# Patient Record
Sex: Female | Born: 1980 | Hispanic: Yes | Marital: Single | Smoking: Never smoker
Health system: Southern US, Community
[De-identification: ages and names within clinical notes are randomized; demographics above are authoritative.]

## PROBLEM LIST (undated history)

## (undated) ENCOUNTER — Emergency Department: Payer: Self-pay

## (undated) DIAGNOSIS — K831 Obstruction of bile duct: Secondary | ICD-10-CM

## (undated) DIAGNOSIS — O26619 Liver and biliary tract disorders in pregnancy, unspecified trimester: Secondary | ICD-10-CM

## (undated) DIAGNOSIS — O26649 Intrahepatic cholestasis of pregnancy, unspecified trimester: Secondary | ICD-10-CM

---

## 2005-05-12 ENCOUNTER — Emergency Department (HOSPITAL_COMMUNITY): Admission: EM | Admit: 2005-05-12 | Discharge: 2005-05-12 | Payer: Self-pay | Admitting: Emergency Medicine

## 2008-02-04 ENCOUNTER — Inpatient Hospital Stay (HOSPITAL_COMMUNITY): Admission: AD | Admit: 2008-02-04 | Discharge: 2008-02-04 | Payer: Self-pay | Admitting: Obstetrics & Gynecology

## 2008-02-06 ENCOUNTER — Inpatient Hospital Stay (HOSPITAL_COMMUNITY): Admission: AD | Admit: 2008-02-06 | Discharge: 2008-02-06 | Payer: Self-pay | Admitting: Obstetrics & Gynecology

## 2008-02-11 ENCOUNTER — Ambulatory Visit (HOSPITAL_COMMUNITY): Admission: RE | Admit: 2008-02-11 | Discharge: 2008-02-11 | Payer: Self-pay | Admitting: Obstetrics & Gynecology

## 2010-07-04 ENCOUNTER — Ambulatory Visit: Payer: Self-pay | Admitting: Gynecology

## 2011-04-20 LAB — CBC
HCT: 36.2
MCV: 70.1 — ABNORMAL LOW
Platelets: 237
RBC: 5.17 — ABNORMAL HIGH
RDW: 14.1
WBC: 5.2

## 2011-04-20 LAB — POCT PREGNANCY, URINE
Operator id: 242691
Preg Test, Ur: POSITIVE

## 2011-04-20 LAB — WET PREP, GENITAL: Yeast Wet Prep HPF POC: NONE SEEN

## 2011-04-20 LAB — URINALYSIS, ROUTINE W REFLEX MICROSCOPIC
Glucose, UA: NEGATIVE
Leukocytes, UA: NEGATIVE
pH: 6

## 2011-04-20 LAB — ABO/RH: ABO/RH(D): A POS

## 2011-04-20 LAB — URINE MICROSCOPIC-ADD ON

## 2013-09-24 ENCOUNTER — Other Ambulatory Visit (HOSPITAL_COMMUNITY): Payer: Self-pay | Admitting: Family

## 2013-09-24 DIAGNOSIS — Z3689 Encounter for other specified antenatal screening: Secondary | ICD-10-CM

## 2013-09-24 LAB — SICKLE CELL SCREEN: Sickle Cell Screen: NORMAL

## 2013-09-24 LAB — OB RESULTS CONSOLE HGB/HCT, BLOOD
HCT: 34 %
HEMATOCRIT: 34 %
HEMOGLOBIN: 10.8 g/dL
Hemoglobin: 10.8 g/dL

## 2013-09-24 LAB — CYSTIC FIBROSIS DIAGNOSTIC STUDY: INTERPRETATION-CFDNA: NEGATIVE

## 2013-09-24 LAB — OB RESULTS CONSOLE HIV ANTIBODY (ROUTINE TESTING)
HIV: NONREACTIVE
HIV: NONREACTIVE

## 2013-09-24 LAB — OB RESULTS CONSOLE RPR
RPR: NONREACTIVE
RPR: NONREACTIVE
RPR: NONREACTIVE

## 2013-09-24 LAB — OB RESULTS CONSOLE VARICELLA ZOSTER ANTIBODY, IGG: Varicella: IMMUNE

## 2013-09-24 LAB — OB RESULTS CONSOLE PLATELET COUNT
PLATELETS: 252 10*3/uL
Platelets: 252 10*3/uL

## 2013-09-24 LAB — GLUCOSE, 1 HOUR GESTATIONAL: GLUCOSE: 74

## 2013-09-24 LAB — OB RESULTS CONSOLE ABO/RH: RH TYPE: POSITIVE

## 2013-09-24 LAB — OB RESULTS CONSOLE ANTIBODY SCREEN: Antibody Screen: NEGATIVE

## 2013-09-24 LAB — OB RESULTS CONSOLE GC/CHLAMYDIA
CHLAMYDIA, DNA PROBE: NEGATIVE
Gonorrhea: NEGATIVE

## 2013-09-24 LAB — OB RESULTS CONSOLE RUBELLA ANTIBODY, IGM: Rubella: IMMUNE

## 2013-09-24 LAB — OB RESULTS CONSOLE HEPATITIS B SURFACE ANTIGEN: Hepatitis B Surface Ag: NEGATIVE

## 2013-10-01 ENCOUNTER — Ambulatory Visit (HOSPITAL_COMMUNITY)
Admission: RE | Admit: 2013-10-01 | Discharge: 2013-10-01 | Disposition: A | Discharge status: Home / Self Care | Payer: Medicaid Other | Source: Ambulatory Visit | Attending: Family | Admitting: Family

## 2013-10-01 DIAGNOSIS — Z3689 Encounter for other specified antenatal screening: Secondary | ICD-10-CM | POA: Insufficient documentation

## 2013-10-12 ENCOUNTER — Other Ambulatory Visit: Payer: Self-pay

## 2013-10-23 ENCOUNTER — Other Ambulatory Visit (HOSPITAL_COMMUNITY): Payer: Self-pay | Admitting: Family

## 2013-10-23 DIAGNOSIS — O283 Abnormal ultrasonic finding on antenatal screening of mother: Secondary | ICD-10-CM

## 2013-10-29 ENCOUNTER — Other Ambulatory Visit (HOSPITAL_COMMUNITY): Payer: Self-pay | Admitting: Family

## 2013-10-29 ENCOUNTER — Ambulatory Visit (HOSPITAL_COMMUNITY)
Admission: RE | Admit: 2013-10-29 | Discharge: 2013-10-29 | Disposition: A | Discharge status: Home / Self Care | Payer: Medicaid Other | Source: Ambulatory Visit | Attending: Family | Admitting: Family

## 2013-10-29 ENCOUNTER — Encounter (HOSPITAL_COMMUNITY): Payer: Self-pay

## 2013-10-29 DIAGNOSIS — Z1389 Encounter for screening for other disorder: Secondary | ICD-10-CM | POA: Insufficient documentation

## 2013-10-29 DIAGNOSIS — O280 Abnormal hematological finding on antenatal screening of mother: Secondary | ICD-10-CM

## 2013-10-29 DIAGNOSIS — Z363 Encounter for antenatal screening for malformations: Secondary | ICD-10-CM | POA: Insufficient documentation

## 2013-10-29 DIAGNOSIS — O283 Abnormal ultrasonic finding on antenatal screening of mother: Secondary | ICD-10-CM

## 2013-10-29 DIAGNOSIS — O358XX0 Maternal care for other (suspected) fetal abnormality and damage, not applicable or unspecified: Secondary | ICD-10-CM | POA: Insufficient documentation

## 2013-10-29 DIAGNOSIS — O289 Unspecified abnormal findings on antenatal screening of mother: Secondary | ICD-10-CM | POA: Insufficient documentation

## 2013-11-10 ENCOUNTER — Other Ambulatory Visit (HOSPITAL_COMMUNITY): Payer: Self-pay | Admitting: Family

## 2013-11-10 DIAGNOSIS — O289 Unspecified abnormal findings on antenatal screening of mother: Secondary | ICD-10-CM

## 2013-11-10 DIAGNOSIS — O358XX0 Maternal care for other (suspected) fetal abnormality and damage, not applicable or unspecified: Secondary | ICD-10-CM

## 2013-12-10 ENCOUNTER — Encounter (HOSPITAL_COMMUNITY): Payer: Self-pay

## 2013-12-10 ENCOUNTER — Ambulatory Visit (HOSPITAL_COMMUNITY)
Admission: RE | Admit: 2013-12-10 | Discharge: 2013-12-10 | Disposition: A | Discharge status: Home / Self Care | Payer: Self-pay | Source: Ambulatory Visit | Attending: Family | Admitting: Family

## 2013-12-10 DIAGNOSIS — O289 Unspecified abnormal findings on antenatal screening of mother: Secondary | ICD-10-CM | POA: Insufficient documentation

## 2013-12-10 DIAGNOSIS — O358XX0 Maternal care for other (suspected) fetal abnormality and damage, not applicable or unspecified: Secondary | ICD-10-CM | POA: Insufficient documentation

## 2014-01-11 LAB — GLUCOSE TOLERANCE, 1 HOUR (50G) W/O FASTING: Glucose, 1 hour: 93

## 2014-01-20 ENCOUNTER — Encounter: Payer: Self-pay | Admitting: Obstetrics & Gynecology

## 2014-01-20 ENCOUNTER — Encounter: Payer: Self-pay | Admitting: *Deleted

## 2014-01-20 ENCOUNTER — Ambulatory Visit (INDEPENDENT_AMBULATORY_CARE_PROVIDER_SITE_OTHER): Payer: Self-pay | Admitting: Obstetrics & Gynecology

## 2014-01-20 VITALS — BP 104/67 | HR 108 | Temp 97.7°F | Ht 60.0 in | Wt 134.9 lb

## 2014-01-20 DIAGNOSIS — O280 Abnormal hematological finding on antenatal screening of mother: Secondary | ICD-10-CM

## 2014-01-20 DIAGNOSIS — O289 Unspecified abnormal findings on antenatal screening of mother: Secondary | ICD-10-CM

## 2014-01-20 DIAGNOSIS — K838 Other specified diseases of biliary tract: Secondary | ICD-10-CM

## 2014-01-20 DIAGNOSIS — O0990 Supervision of high risk pregnancy, unspecified, unspecified trimester: Secondary | ICD-10-CM | POA: Insufficient documentation

## 2014-01-20 DIAGNOSIS — O26619 Liver and biliary tract disorders in pregnancy, unspecified trimester: Secondary | ICD-10-CM

## 2014-01-20 DIAGNOSIS — O0993 Supervision of high risk pregnancy, unspecified, third trimester: Secondary | ICD-10-CM

## 2014-01-20 DIAGNOSIS — O26613 Liver and biliary tract disorders in pregnancy, third trimester: Principal | ICD-10-CM

## 2014-01-20 DIAGNOSIS — K831 Obstruction of bile duct: Secondary | ICD-10-CM

## 2014-01-20 LAB — POCT URINALYSIS DIP (DEVICE)
Bilirubin Urine: NEGATIVE
GLUCOSE, UA: NEGATIVE mg/dL
HGB URINE DIPSTICK: NEGATIVE
KETONES UR: NEGATIVE mg/dL
Leukocytes, UA: NEGATIVE
Nitrite: NEGATIVE
Protein, ur: 30 mg/dL — AB
SPECIFIC GRAVITY, URINE: 1.02 (ref 1.005–1.030)
UROBILINOGEN UA: 0.2 mg/dL (ref 0.0–1.0)
pH: 7 (ref 5.0–8.0)

## 2014-01-20 MED ORDER — URSODIOL 500 MG PO TABS: 500.0000 mg | ORAL_TABLET | Freq: Two times a day (BID) | ORAL | Status: DC

## 2014-01-20 NOTE — Patient Instructions (Signed)
Cholestasis of Pregnancy Cholestasis refers to any condition that causes the flow of the digestive fluid (bile) produced by your liver to slow or stop. Cholestasis of pregnancy is most common toward the end of pregnancy (thirdtrimester), but it can occur any time during your pregnancy. The condition often goes away soon after your baby is born.  Cholestasis may be uncomfortable but is usually harmless to you. However, it can be harmful to your baby. Cholestasis may increase the risk that your baby will be born too early (preterm delivery).  CAUSES  The cause of cholestasis of pregnancy is not known. Pregnancy hormones may affect the way your gallbladder functions. Your gallbladder normally holds the bile from your liver until you need it to help digest fat in your diet. Pregnancy hormones may cause the flow of bile to slow down and back up into your liver. Bile may then get into your bloodstream and cause cholestasis symptoms. RISK FACTORS You may be at increased risk if:  You had cholestasis during a previous pregnancy.  You have a family history of cholestasis.  You have liver problems.  You are having twins. SIGNS AND SYMPTOMS  The most common symptom of cholestasis of pregnancy is intense itching, especially on the palms of your hands and soles of your feet. The itching can spread to the rest of your body and is often worse at night. You will not usually have a rash. Other symptoms may include:   Feeling tired.   Yellowish discoloration of your skin and the whites of your eyes (jaundice).   Dark-colored urine.   Light-colored stools.  Poor appetite.  DIAGNOSIS  Your health care provider will take your medical history and do a physical exam. You may have blood tests to check your liver function, bile level, and bilirubin level.  TREATMENT  Treatment is meant to make you more comfortable and keep your baby safe. Your health care provider may prescribe medicine to relieve your  itching. The medicine used may also improve your blood test results and help keep your baby safe. Your health care provider may also give you vitamin K before delivery to prevent excessive bleeding.  Your health care provider may want to check your baby (fetal monitoring) frequently, as often as every 2 weeks. Once your baby's lungs have developed enough, your health care provider may recommend starting (inducing) your labor and delivery by week 37 of your pregnancy. HOME CARE INSTRUCTIONS   Only use anti-itch creams and take medicines as directed by your health care provider.  Take cool baths to soothe your itching.   Keep your fingernails short to prevent skin irritation from scratching.   Keep all your appointments for fetal monitoring. SEEK MEDICAL CARE IF:  Your symptoms get worse, even with treatment. SEEK IMMEDIATE MEDICAL CARE IF: You go into early labor at home. MAKE SURE YOU:  Understand these instructions.  Will watch your condition.  Will get help right away if you are not doing well or get worse. Document Released: 07/06/2000 Document Revised: 07/14/2013 Document Reviewed: 05/01/2013 [facility] Patient Information 2015 [employer] [place]. This information is not intended to replace advice given to you by your health care provider. Make sure you discuss any questions you have with your health care provider.

## 2014-01-20 NOTE — Progress Notes (Signed)
Patient expressed unable to get Actigall due to financial constraints. Per Dr. Debroah LoopArnold this is medically needed. Contacted Care Management- [facility] [employer] - she will look into this and see if we can help Dominique Foster and call her within the next few days.   Also scheduled BPP as ordered for first available date 01/26/14 and per Dr. Debroah LoopArnold US for growth one week after that since she needs weekly BPP.   01/21/14 notified by nurse care manager they can get her a one month supply of Actigall for $3 for this one time only. They have notified the patient.  Next month she will have to pay out of pocket or hopefully will have insurance then.

## 2014-01-20 NOTE — Progress Notes (Signed)
Patient reports occasional abdominal pain

## 2014-01-20 NOTE — Progress Notes (Signed)
[facility] with generalized itch and bile acids 25 normal LFT o/w. [facility] testing. Will schedule growth US and BPP, NST start at 32 weeks, Rx Actigall 500 mg BID Increased DSR by [employer] screen and EIF on US Records from HD reviewed.

## 2014-01-21 NOTE — Progress Notes (Signed)
01/21/14  915a  CM spoke w/ Stark BrayLynda in clinic to determine [facility].  [facility]  CM called [facility] and spoke w/ Morrie SheldonAshley who verified that she did have the Rx.  CM explained that we would assist the pt in obtaining the Actigall and will fax over the approval letter. Fax number is [phone].  Morrie Sheldonshley requested to have the pt give them about an hour to have the Rx ready for pick up.  CM called and spoke w/ the pt to let her know that the Rx would be ready in about an hour and her copay is $3.00 and that this approval is for 1 month only and can only be used 1 time per year.  Pt voiced understanding.  CM available to assist as needed.   [phone]    Late Entry:  01/20/14  1620p  [facility] to know if CM could assist pt w/ obtaining her Actigall as the pt stated that it was too expensive - $300.00.  Pt does not have Medicaid at this time as she is in the process of applying.  Pt is roughly [redacted] wks gestation.  CM called and spoke w/ the pt at [phone] (before 1100am and [phone] after 11am) to discuss her Rx.  CM explained that approval is good for 34 days only w/ no refills and can only be used 1 time per year.  Pt voiced understanding.  CM unsure if she had the Rx or if her pharmacy had it.  [facility] [phone].  CM will follow up w/ [facility] and call the pt back most likely in am.  Pt voiced understanding.  [facility]  CM will try again in am.  TJohnson, RNBSN

## 2014-01-26 ENCOUNTER — Ambulatory Visit (HOSPITAL_COMMUNITY): Admission: RE | Admit: 2014-01-26 | Payer: Self-pay | Source: Ambulatory Visit

## 2014-01-26 DIAGNOSIS — D649 Anemia, unspecified: Secondary | ICD-10-CM | POA: Insufficient documentation

## 2014-01-28 ENCOUNTER — Ambulatory Visit (INDEPENDENT_AMBULATORY_CARE_PROVIDER_SITE_OTHER): Payer: Self-pay | Admitting: Obstetrics & Gynecology

## 2014-01-28 ENCOUNTER — Other Ambulatory Visit: Payer: Self-pay | Admitting: Obstetrics & Gynecology

## 2014-01-28 VITALS — BP 120/66 | HR 98 | Temp 97.9°F | Wt 138.1 lb

## 2014-01-28 DIAGNOSIS — O26619 Liver and biliary tract disorders in pregnancy, unspecified trimester: Secondary | ICD-10-CM

## 2014-01-28 DIAGNOSIS — O26613 Liver and biliary tract disorders in pregnancy, third trimester: Principal | ICD-10-CM

## 2014-01-28 DIAGNOSIS — O280 Abnormal hematological finding on antenatal screening of mother: Secondary | ICD-10-CM

## 2014-01-28 DIAGNOSIS — O0990 Supervision of high risk pregnancy, unspecified, unspecified trimester: Secondary | ICD-10-CM

## 2014-01-28 DIAGNOSIS — K831 Obstruction of bile duct: Secondary | ICD-10-CM

## 2014-01-28 DIAGNOSIS — O0993 Supervision of high risk pregnancy, unspecified, third trimester: Secondary | ICD-10-CM

## 2014-01-28 DIAGNOSIS — K838 Other specified diseases of biliary tract: Secondary | ICD-10-CM

## 2014-01-28 LAB — POCT URINALYSIS DIP (DEVICE)
BILIRUBIN URINE: NEGATIVE
GLUCOSE, UA: NEGATIVE mg/dL
Hgb urine dipstick: NEGATIVE
Ketones, ur: NEGATIVE mg/dL
NITRITE: NEGATIVE
Protein, ur: 30 mg/dL — AB
Specific Gravity, Urine: 1.02 (ref 1.005–1.030)
Urobilinogen, UA: 0.2 mg/dL (ref 0.0–1.0)
pH: 7 (ref 5.0–8.0)

## 2014-01-28 NOTE — Patient Instructions (Signed)
Return to clinic for any obstetric concerns or go to [facility] for evaluation  

## 2014-01-28 NOTE — Progress Notes (Signed)
Follow up ultrasound and BPP scheduled for tomorrow 7/10 @ 830 with MFM

## 2014-01-28 NOTE — Progress Notes (Signed)
Missed BPP and follow up scan, will reschedule for today/tomorrow Will start twice a week testing next week Continue Ursodiol and Atarax as prescribed. No other complaints or concerns.  Fetal movement and labor precautions reviewed.

## 2014-01-29 ENCOUNTER — Encounter (HOSPITAL_COMMUNITY): Payer: Self-pay

## 2014-01-29 ENCOUNTER — Ambulatory Visit (HOSPITAL_COMMUNITY)
Admission: RE | Admit: 2014-01-29 | Discharge: 2014-01-29 | Disposition: A | Discharge status: Home / Self Care | Payer: Self-pay | Source: Ambulatory Visit | Attending: Obstetrics & Gynecology | Admitting: Obstetrics & Gynecology

## 2014-01-29 DIAGNOSIS — O26619 Liver and biliary tract disorders in pregnancy, unspecified trimester: Secondary | ICD-10-CM | POA: Insufficient documentation

## 2014-01-29 DIAGNOSIS — K838 Other specified diseases of biliary tract: Secondary | ICD-10-CM | POA: Insufficient documentation

## 2014-01-29 DIAGNOSIS — O0993 Supervision of high risk pregnancy, unspecified, third trimester: Secondary | ICD-10-CM

## 2014-01-29 DIAGNOSIS — O26613 Liver and biliary tract disorders in pregnancy, third trimester: Secondary | ICD-10-CM

## 2014-01-29 DIAGNOSIS — K831 Obstruction of bile duct: Secondary | ICD-10-CM

## 2014-01-29 DIAGNOSIS — O280 Abnormal hematological finding on antenatal screening of mother: Secondary | ICD-10-CM

## 2014-01-29 DIAGNOSIS — O289 Unspecified abnormal findings on antenatal screening of mother: Secondary | ICD-10-CM | POA: Insufficient documentation

## 2014-01-29 DIAGNOSIS — O09899 Supervision of other high risk pregnancies, unspecified trimester: Secondary | ICD-10-CM | POA: Insufficient documentation

## 2014-02-01 ENCOUNTER — Ambulatory Visit (INDEPENDENT_AMBULATORY_CARE_PROVIDER_SITE_OTHER): Payer: Self-pay | Admitting: *Deleted

## 2014-02-01 VITALS — BP 111/70 | HR 103

## 2014-02-01 DIAGNOSIS — K831 Obstruction of bile duct: Secondary | ICD-10-CM

## 2014-02-01 DIAGNOSIS — O26613 Liver and biliary tract disorders in pregnancy, third trimester: Principal | ICD-10-CM

## 2014-02-01 DIAGNOSIS — K838 Other specified diseases of biliary tract: Secondary | ICD-10-CM

## 2014-02-01 DIAGNOSIS — O26619 Liver and biliary tract disorders in pregnancy, unspecified trimester: Secondary | ICD-10-CM

## 2014-02-01 NOTE — Progress Notes (Signed)
NST reviewed and reactive.  

## 2014-02-02 ENCOUNTER — Ambulatory Visit (HOSPITAL_COMMUNITY): Payer: Self-pay

## 2014-02-04 ENCOUNTER — Ambulatory Visit (INDEPENDENT_AMBULATORY_CARE_PROVIDER_SITE_OTHER): Payer: Self-pay | Admitting: Family Medicine

## 2014-02-04 VITALS — BP 106/73 | HR 87 | Wt 140.5 lb

## 2014-02-04 DIAGNOSIS — O0993 Supervision of high risk pregnancy, unspecified, third trimester: Secondary | ICD-10-CM

## 2014-02-04 DIAGNOSIS — K838 Other specified diseases of biliary tract: Secondary | ICD-10-CM

## 2014-02-04 DIAGNOSIS — K831 Obstruction of bile duct: Secondary | ICD-10-CM

## 2014-02-04 DIAGNOSIS — O26619 Liver and biliary tract disorders in pregnancy, unspecified trimester: Secondary | ICD-10-CM

## 2014-02-04 DIAGNOSIS — O0990 Supervision of high risk pregnancy, unspecified, unspecified trimester: Secondary | ICD-10-CM

## 2014-02-04 DIAGNOSIS — O26613 Liver and biliary tract disorders in pregnancy, third trimester: Principal | ICD-10-CM

## 2014-02-04 LAB — POCT URINALYSIS DIP (DEVICE)
Bilirubin Urine: NEGATIVE
Glucose, UA: NEGATIVE mg/dL
Hgb urine dipstick: NEGATIVE
Ketones, ur: NEGATIVE mg/dL
NITRITE: NEGATIVE
PH: 7 (ref 5.0–8.0)
Protein, ur: NEGATIVE mg/dL
Specific Gravity, Urine: 1.015 (ref 1.005–1.030)
Urobilinogen, UA: 0.2 mg/dL (ref 0.0–1.0)

## 2014-02-04 LAB — FETAL NONSTRESS TEST

## 2014-02-04 NOTE — Progress Notes (Signed)
NST reviewed and reactive. Continues to itch but better with medication Plan is for delivery at 37 wks.

## 2014-02-04 NOTE — Patient Instructions (Signed)
Third Trimester of Pregnancy The third trimester is from week 29 through week 42, months 7 through 9. The third trimester is a time when the fetus is growing rapidly. At the end of the ninth month, the fetus is about 20 inches in length and weighs 6-10 pounds.  BODY CHANGES Your body goes through many changes during pregnancy. The changes vary from woman to woman.   Your weight will continue to increase. You can expect to gain 25-35 pounds (11-16 kg) by the end of the pregnancy.  You may begin to get stretch marks on your hips, abdomen, and breasts.  You may urinate more often because the fetus is moving lower into your pelvis and pressing on your bladder.  You may develop or continue to have heartburn as a result of your pregnancy.  You may develop constipation because certain hormones are causing the muscles that push waste through your intestines to slow down.  You may develop hemorrhoids or swollen, bulging veins (varicose veins).  You may have pelvic pain because of the weight gain and pregnancy hormones relaxing your joints between the bones in your pelvis. Backaches may result from overexertion of the muscles supporting your posture.  You may have changes in your hair. These can include thickening of your hair, rapid growth, and changes in texture. Some women also have hair loss during or after pregnancy, or hair that feels dry or thin. Your hair will most likely return to normal after your baby is born.  Your breasts will continue to grow and be tender. A yellow discharge may leak from your breasts called colostrum.  Your belly button may stick out.  You may feel short of breath because of your expanding uterus.  You may notice the fetus "dropping," or moving lower in your abdomen.  You may have a bloody mucus discharge. This usually occurs a few days to a week before labor begins.  Your cervix becomes thin and soft (effaced) near your due date. WHAT TO EXPECT AT YOUR  PRENATAL EXAMS  You will have prenatal exams every 2 weeks until week 36. Then, you will have weekly prenatal exams. During a routine prenatal visit:  You will be weighed to make sure you and the fetus are growing normally.  Your blood pressure is taken.  Your abdomen will be measured to track your baby's growth.  The fetal heartbeat will be listened to.  Any test results from the previous visit will be discussed.  You may have a cervical check near your due date to see if you have effaced. At around 36 weeks, your caregiver will check your cervix. At the same time, your caregiver will also perform a test on the secretions of the vaginal tissue. This test is to determine if a type of bacteria, Group B streptococcus, is present. Your caregiver will explain this further. Your caregiver may ask you:  What your birth plan is.  How you are feeling.  If you are feeling the baby move.  If you have had any abnormal symptoms, such as leaking fluid, bleeding, severe headaches, or abdominal cramping.  If you have any questions. Other tests or screenings that may be performed during your third trimester include:  Blood tests that check for low iron levels (anemia).  Fetal testing to check the health, activity level, and growth of the fetus. Testing is done if you have certain medical conditions or if there are problems during the pregnancy. FALSE LABOR You may feel small, irregular contractions that   eventually go away. These are called Braxton Hicks contractions, or false labor. Contractions may last for hours, days, or even weeks before true labor sets in. If contractions come at regular intervals, intensify, or become painful, it is best to be seen by your caregiver.  SIGNS OF LABOR   Menstrual-like cramps.  Contractions that are 5 minutes apart or less.  Contractions that start on the top of the uterus and spread down to the lower abdomen and back.  A sense of increased pelvic  pressure or back pain.  A watery or bloody mucus discharge that comes from the vagina. If you have any of these signs before the 37th week of pregnancy, call your caregiver right away. You need to go to the hospital to get checked immediately. HOME CARE INSTRUCTIONS   Avoid all smoking, herbs, alcohol, and unprescribed drugs. These chemicals affect the formation and growth of the baby.  Follow your caregiver's instructions regarding medicine use. There are medicines that are either safe or unsafe to take during pregnancy.  Exercise only as directed by your caregiver. Experiencing uterine cramps is a good sign to stop exercising.  Continue to eat regular, healthy meals.  Wear a good support bra for breast tenderness.  Do not use hot tubs, steam rooms, or saunas.  Wear your seat belt at all times when driving.  Avoid raw meat, uncooked cheese, cat litter boxes, and soil used by cats. These carry germs that can cause birth defects in the baby.  Take your prenatal vitamins.  Try taking a stool softener (if your caregiver approves) if you develop constipation. Eat more high-fiber foods, such as fresh vegetables or fruit and whole grains. Drink plenty of fluids to keep your urine clear or pale yellow.  Take warm sitz baths to soothe any pain or discomfort caused by hemorrhoids. Use hemorrhoid cream if your caregiver approves.  If you develop varicose veins, wear support hose. Elevate your feet for 15 minutes, 3-4 times a day. Limit salt in your diet.  Avoid heavy lifting, wear low heal shoes, and practice good posture.  Rest a lot with your legs elevated if you have leg cramps or low back pain.  Visit your dentist if you have not gone during your pregnancy. Use a soft toothbrush to brush your teeth and be gentle when you floss.  A sexual relationship may be continued unless your caregiver directs you otherwise.  Do not travel far distances unless it is absolutely necessary and only  with the approval of your caregiver.  Take prenatal classes to understand, practice, and ask questions about the labor and delivery.  Make a trial run to the hospital.  Pack your hospital bag.  Prepare the baby's nursery.  Continue to go to all your prenatal visits as directed by your caregiver. SEEK MEDICAL CARE IF:  You are unsure if you are in labor or if your water has broken.  You have dizziness.  You have mild pelvic cramps, pelvic pressure, or nagging pain in your abdominal area.  You have persistent nausea, vomiting, or diarrhea.  You have a bad smelling vaginal discharge.  You have pain with urination. SEEK IMMEDIATE MEDICAL CARE IF:   You have a fever.  You are leaking fluid from your vagina.  You have spotting or bleeding from your vagina.  You have severe abdominal cramping or pain.  You have rapid weight loss or gain.  You have shortness of breath with chest pain.  You notice sudden or extreme swelling   of your face, hands, ankles, feet, or legs.  You have not felt your baby move in over an hour.  You have severe headaches that do not go away with medicine.  You have vision changes. Document Released: 07/03/2001 Document Revised: 07/14/2013 Document Reviewed: 09/09/2012 [employer] Patient Information 2015 [employer]. This information is not intended to replace advice given to you by your health care provider. Make sure you discuss any questions you have with your health care provider.  Breastfeeding Deciding to breastfeed is one of the best choices you can make for you and your baby. A change in hormones during pregnancy causes your breast tissue to grow and increases the number and size of your milk ducts. These hormones also allow proteins, sugars, and fats from your blood supply to make breast milk in your milk-producing glands. Hormones prevent breast milk from being released before your baby is born as well as prompt milk flow after birth. Once  breastfeeding has begun, thoughts of your baby, as well as his or her sucking or crying, can stimulate the release of milk from your milk-producing glands.  BENEFITS OF BREASTFEEDING For Your Baby  Your first milk (colostrum) helps your baby's digestive system function better.   There are antibodies in your milk that help your baby fight off infections.   Your baby has a lower incidence of asthma, allergies, and sudden infant death syndrome.   The nutrients in breast milk are better for your baby than infant formulas and are designed uniquely for your baby's needs.   Breast milk improves your baby's brain development.   Your baby is less likely to develop other conditions, such as childhood obesity, asthma, or type 2 diabetes mellitus.  For You   Breastfeeding helps to create a very special bond between you and your baby.   Breastfeeding is convenient. Breast milk is always available at the correct temperature and costs nothing.   Breastfeeding helps to burn calories and helps you lose the weight gained during pregnancy.   Breastfeeding makes your uterus contract to its prepregnancy size faster and slows bleeding (lochia) after you give birth.   Breastfeeding helps to lower your risk of developing type 2 diabetes mellitus, osteoporosis, and breast or ovarian cancer later in life. SIGNS THAT YOUR BABY IS HUNGRY Early Signs of Hunger  Increased alertness or activity.  Stretching.  Movement of the head from side to side.  Movement of the head and opening of the mouth when the corner of the mouth or cheek is stroked (rooting).  Increased sucking sounds, smacking lips, cooing, sighing, or squeaking.  Hand-to-mouth movements.  Increased sucking of fingers or hands. Late Signs of Hunger  Fussing.  Intermittent crying. Extreme Signs of Hunger Signs of extreme hunger will require calming and consoling before your baby will be able to breastfeed successfully. Do not  wait for the following signs of extreme hunger to occur before you initiate breastfeeding:   Restlessness.  A loud, strong cry.   Screaming. BREASTFEEDING BASICS Breastfeeding Initiation  Find a comfortable place to sit or lie down, with your neck and back well supported.  Place a pillow or rolled up blanket under your baby to bring him or her to the level of your breast (if you are seated). Nursing pillows are specially designed to help support your arms and your baby while you breastfeed.  Make sure that your baby's abdomen is facing your abdomen.   Gently massage your breast. With your fingertips, massage from your chest   wall toward your nipple in a circular motion. This encourages milk flow. You may need to continue this action during the feeding if your milk flows slowly.  Support your breast with 4 fingers underneath and your thumb above your nipple. Make sure your fingers are well away from your nipple and your baby's mouth.   Stroke your baby's lips gently with your finger or nipple.   When your baby's mouth is open wide enough, quickly bring your baby to your breast, placing your entire nipple and as much of the colored area around your nipple (areola) as possible into your baby's mouth.   More areola should be visible above your baby's upper lip than below the lower lip.   Your baby's tongue should be between his or her lower gum and your breast.   Ensure that your baby's mouth is correctly positioned around your nipple (latched). Your baby's lips should create a seal on your breast and be turned out (everted).  It is common for your baby to suck about 2-3 minutes in order to start the flow of breast milk. Latching Teaching your baby how to latch on to your breast properly is very important. An improper latch can cause nipple pain and decreased milk supply for you and poor weight gain in your baby. Also, if your baby is not latched onto your nipple properly, he or she  may swallow some air during feeding. This can make your baby fussy. Burping your baby when you switch breasts during the feeding can help to get rid of the air. However, teaching your baby to latch on properly is still the best way to prevent fussiness from swallowing air while breastfeeding. Signs that your baby has successfully latched on to your nipple:    Silent tugging or silent sucking, without causing you pain.   Swallowing heard between every 3-4 sucks.    Muscle movement above and in front of his or her ears while sucking.  Signs that your baby has not successfully latched on to nipple:   Sucking sounds or smacking sounds from your baby while breastfeeding.  Nipple pain. If you think your baby has not latched on correctly, slip your finger into the corner of your baby's mouth to break the suction and place it between your baby's gums. Attempt breastfeeding initiation again. Signs of Successful Breastfeeding Signs from your baby:   A gradual decrease in the number of sucks or complete cessation of sucking.   Falling asleep.   Relaxation of his or her body.   Retention of a small amount of milk in his or her mouth.   Letting go of your breast by himself or herself. Signs from you:  Breasts that have increased in firmness, weight, and size 1-3 hours after feeding.   Breasts that are softer immediately after breastfeeding.  Increased milk volume, as well as a change in milk consistency and color by the fifth day of breastfeeding.   Nipples that are not sore, cracked, or bleeding. Signs That Your Baby is Getting Enough Milk  Wetting at least 3 diapers in a 24-hour period. The urine should be clear and pale yellow by age 5 days.  At least 3 stools in a 24-hour period by age 5 days. The stool should be soft and yellow.  At least 3 stools in a 24-hour period by age 7 days. The stool should be seedy and yellow.  No loss of weight greater than 10% of birth weight  during the first 3   days of age.  Average weight gain of 4-7 ounces (113-198 g) per week after age 4 days.  Consistent daily weight gain by age 5 days, without weight loss after the age of 2 weeks. After a feeding, your baby may spit up a small amount. This is common. BREASTFEEDING FREQUENCY AND DURATION Frequent feeding will help you make more milk and can prevent sore nipples and breast engorgement. Breastfeed when you feel the need to reduce the fullness of your breasts or when your baby shows signs of hunger. This is called "breastfeeding on demand." Avoid introducing a pacifier to your baby while you are working to establish breastfeeding (the first 4-6 weeks after your baby is born). After this time you may choose to use a pacifier. Research has shown that pacifier use during the first year of a baby's life decreases the risk of sudden infant death syndrome (SIDS). Allow your baby to feed on each breast as long as he or she wants. Breastfeed until your baby is finished feeding. When your baby unlatches or falls asleep while feeding from the first breast, offer the second breast. Because newborns are often sleepy in the first few weeks of life, you may need to awaken your baby to get him or her to feed. Breastfeeding times will vary from baby to baby. However, the following rules can serve as a guide to help you ensure that your baby is properly fed:  Newborns (babies 4 weeks of age or younger) may breastfeed every 1-3 hours.  Newborns should not go longer than 3 hours during the day or 5 hours during the night without breastfeeding.  You should breastfeed your baby a minimum of 8 times in a 24-hour period until you begin to introduce solid foods to your baby at around 6 months of age. BREAST MILK PUMPING Pumping and storing breast milk allows you to ensure that your baby is exclusively fed your breast milk, even at times when you are unable to breastfeed. This is especially important if you are  going back to work while you are still breastfeeding or when you are not able to be present during feedings. Your lactation consultant can give you guidelines on how long it is safe to store breast milk.  A breast pump is a machine that allows you to pump milk from your breast into a sterile bottle. The pumped breast milk can then be stored in a refrigerator or freezer. Some breast pumps are operated by hand, while others use electricity. Ask your lactation consultant which type will work best for you. Breast pumps can be purchased, but some hospitals and breastfeeding support groups lease breast pumps on a monthly basis. A lactation consultant can teach you how to hand express breast milk, if you prefer not to use a pump.  CARING FOR YOUR BREASTS WHILE YOU BREASTFEED Nipples can become dry, cracked, and sore while breastfeeding. The following recommendations can help keep your breasts moisturized and healthy:  Avoid using soap on your nipples.   Wear a supportive bra. Although not required, special nursing bras and tank tops are designed to allow access to your breasts for breastfeeding without taking off your entire bra or top. Avoid wearing underwire-style bras or extremely tight bras.  Air dry your nipples for 3-4minutes after each feeding.   Use only cotton bra pads to absorb leaked breast milk. Leaking of breast milk between feedings is normal.   Use lanolin on your nipples after breastfeeding. Lanolin helps to maintain your skin's   normal moisture barrier. If you use pure lanolin, you do not need to wash it off before feeding your baby again. Pure lanolin is not toxic to your baby. You may also hand express a few drops of breast milk and gently massage that milk into your nipples and allow the milk to air dry. In the first few weeks after giving birth, some women experience extremely full breasts (engorgement). Engorgement can make your breasts feel heavy, warm, and tender to the touch.  Engorgement peaks within 3-5 days after you give birth. The following recommendations can help ease engorgement:  Completely empty your breasts while breastfeeding or pumping. You may want to start by applying warm, moist heat (in the shower or with warm water-soaked hand towels) just before feeding or pumping. This increases circulation and helps the milk flow. If your baby does not completely empty your breasts while breastfeeding, pump any extra milk after he or she is finished.  Wear a snug bra (nursing or regular) or tank top for 1-2 days to signal your body to slightly decrease milk production.  Apply ice packs to your breasts, unless this is too uncomfortable for you.  Make sure that your baby is latched on and positioned properly while breastfeeding. If engorgement persists after 48 hours of following these recommendations, contact your health care provider or a lactation consultant. OVERALL HEALTH CARE RECOMMENDATIONS WHILE BREASTFEEDING  Eat healthy foods. Alternate between meals and snacks, eating 3 of each per day. Because what you eat affects your breast milk, some of the foods may make your baby more irritable than usual. Avoid eating these foods if you are sure that they are negatively affecting your baby.  Drink milk, fruit juice, and water to satisfy your thirst (about 10 glasses a day).   Rest often, relax, and continue to take your prenatal vitamins to prevent fatigue, stress, and anemia.  Continue breast self-awareness checks.  Avoid chewing and smoking tobacco.  Avoid alcohol and drug use. Some medicines that may be harmful to your baby can pass through breast milk. It is important to ask your health care provider before taking any medicine, including all over-the-counter and prescription medicine as well as vitamin and herbal supplements. It is possible to become pregnant while breastfeeding. If birth control is desired, ask your health care provider about options that  will be safe for your baby. SEEK MEDICAL CARE IF:   You feel like you want to stop breastfeeding or have become frustrated with breastfeeding.  You have painful breasts or nipples.  Your nipples are cracked or bleeding.  Your breasts are red, tender, or warm.  You have a swollen area on either breast.  You have a fever or chills.  You have nausea or vomiting.  You have drainage other than breast milk from your nipples.  Your breasts do not become full before feedings by the fifth day after you give birth.  You feel sad and depressed.  Your baby is too sleepy to eat well.  Your baby is having trouble sleeping.   Your baby is wetting less than 3 diapers in a 24-hour period.  Your baby has less than 3 stools in a 24-hour period.  Your baby's skin or the white part of his or her eyes becomes yellow.   Your baby is not gaining weight by 5 days of age. SEEK IMMEDIATE MEDICAL CARE IF:   Your baby is overly tired (lethargic) and does not want to wake up and feed.  Your baby   develops an unexplained fever. Document Released: 07/09/2005 Document Revised: 07/14/2013 Document Reviewed: 12/31/2012 [employer] Patient Information 2015 [employer]. This information is not intended to replace advice given to you by your health care provider. Make sure you discuss any questions you have with your health care provider.  

## 2014-02-08 ENCOUNTER — Ambulatory Visit (INDEPENDENT_AMBULATORY_CARE_PROVIDER_SITE_OTHER): Payer: Self-pay | Admitting: *Deleted

## 2014-02-08 VITALS — BP 117/65 | HR 104

## 2014-02-08 DIAGNOSIS — O26619 Liver and biliary tract disorders in pregnancy, unspecified trimester: Secondary | ICD-10-CM

## 2014-02-08 NOTE — Progress Notes (Signed)
NST

## 2014-02-08 NOTE — Progress Notes (Signed)
NST reviewed and reactive.  Isaid Salvia L. Harraway-Smith, M.D., FACOG    

## 2014-02-11 ENCOUNTER — Ambulatory Visit (HOSPITAL_COMMUNITY)
Admission: RE | Admit: 2014-02-11 | Discharge: 2014-02-11 | Disposition: A | Discharge status: Home / Self Care | Payer: Self-pay | Source: Ambulatory Visit | Attending: Obstetrics & Gynecology | Admitting: Obstetrics & Gynecology

## 2014-02-11 ENCOUNTER — Ambulatory Visit (INDEPENDENT_AMBULATORY_CARE_PROVIDER_SITE_OTHER): Payer: Self-pay | Admitting: Obstetrics & Gynecology

## 2014-02-11 ENCOUNTER — Other Ambulatory Visit: Payer: Self-pay | Admitting: *Deleted

## 2014-02-11 VITALS — BP 105/68 | HR 97 | Temp 98.7°F | Wt 141.6 lb

## 2014-02-11 DIAGNOSIS — O26613 Liver and biliary tract disorders in pregnancy, third trimester: Principal | ICD-10-CM

## 2014-02-11 DIAGNOSIS — K831 Obstruction of bile duct: Secondary | ICD-10-CM

## 2014-02-11 DIAGNOSIS — O26619 Liver and biliary tract disorders in pregnancy, unspecified trimester: Secondary | ICD-10-CM

## 2014-02-11 DIAGNOSIS — Z3689 Encounter for other specified antenatal screening: Secondary | ICD-10-CM | POA: Insufficient documentation

## 2014-02-11 DIAGNOSIS — K838 Other specified diseases of biliary tract: Secondary | ICD-10-CM

## 2014-02-11 LAB — POCT URINALYSIS DIP (DEVICE)
Bilirubin Urine: NEGATIVE
Glucose, UA: NEGATIVE mg/dL
Hgb urine dipstick: NEGATIVE
Ketones, ur: NEGATIVE mg/dL
LEUKOCYTES UA: NEGATIVE
Nitrite: NEGATIVE
PH: 7 (ref 5.0–8.0)
PROTEIN: NEGATIVE mg/dL
Specific Gravity, Urine: 1.015 (ref 1.005–1.030)
UROBILINOGEN UA: 0.2 mg/dL (ref 0.0–1.0)

## 2014-02-11 LAB — COMPREHENSIVE METABOLIC PANEL
ALK PHOS: 141 U/L — AB (ref 39–117)
ALT: 21 U/L (ref 0–35)
AST: 20 U/L (ref 0–37)
Albumin: 2.8 g/dL — ABNORMAL LOW (ref 3.5–5.2)
BUN: 7 mg/dL (ref 6–23)
CO2: 22 mEq/L (ref 19–32)
CREATININE: 0.46 mg/dL — AB (ref 0.50–1.10)
Calcium: 8.8 mg/dL (ref 8.4–10.5)
Chloride: 104 mEq/L (ref 96–112)
Glucose, Bld: 120 mg/dL — ABNORMAL HIGH (ref 70–99)
Potassium: 3.9 mEq/L (ref 3.5–5.3)
SODIUM: 135 meq/L (ref 135–145)
TOTAL PROTEIN: 5.3 g/dL — AB (ref 6.0–8.3)
Total Bilirubin: 0.5 mg/dL (ref 0.2–1.2)

## 2014-02-11 NOTE — Progress Notes (Signed)
Patient reports worsening itching even with her medicines.  Will recheck CMET, bile acids.  Patient also told she can take two tablets of Hydroxyzine as needed for itching.  NST performed today was reviewed and was found to be reactive.  Continue recommended antenatal testing and prenatal care. Will get AFI today.  No other complaints or concerns.  Fetal movement and labor precautions reviewed.

## 2014-02-11 NOTE — Patient Instructions (Signed)
Regrese a la clinica cuando tenga su cita. Si tiene problemas o preguntas, llama a la clinica o vaya a la sala [facility] al [facility].    

## 2014-02-12 LAB — BILE ACIDS, TOTAL: Bile Acids Total: 16 umol/L (ref 0–19)

## 2014-02-16 ENCOUNTER — Ambulatory Visit (INDEPENDENT_AMBULATORY_CARE_PROVIDER_SITE_OTHER): Payer: Self-pay | Admitting: General Practice

## 2014-02-16 VITALS — BP 123/72 | HR 92 | Wt 145.1 lb

## 2014-02-16 DIAGNOSIS — O26619 Liver and biliary tract disorders in pregnancy, unspecified trimester: Secondary | ICD-10-CM

## 2014-02-18 ENCOUNTER — Ambulatory Visit (INDEPENDENT_AMBULATORY_CARE_PROVIDER_SITE_OTHER): Payer: Self-pay | Admitting: Obstetrics & Gynecology

## 2014-02-18 VITALS — BP 123/71 | HR 105 | Wt 143.6 lb

## 2014-02-18 DIAGNOSIS — O26619 Liver and biliary tract disorders in pregnancy, unspecified trimester: Secondary | ICD-10-CM

## 2014-02-18 DIAGNOSIS — O26613 Liver and biliary tract disorders in pregnancy, third trimester: Principal | ICD-10-CM

## 2014-02-18 DIAGNOSIS — O289 Unspecified abnormal findings on antenatal screening of mother: Secondary | ICD-10-CM

## 2014-02-18 DIAGNOSIS — O0993 Supervision of high risk pregnancy, unspecified, third trimester: Secondary | ICD-10-CM

## 2014-02-18 DIAGNOSIS — K838 Other specified diseases of biliary tract: Secondary | ICD-10-CM

## 2014-02-18 DIAGNOSIS — O0990 Supervision of high risk pregnancy, unspecified, unspecified trimester: Secondary | ICD-10-CM

## 2014-02-18 DIAGNOSIS — O280 Abnormal hematological finding on antenatal screening of mother: Secondary | ICD-10-CM

## 2014-02-18 DIAGNOSIS — K831 Obstruction of bile duct: Secondary | ICD-10-CM

## 2014-02-18 LAB — POCT URINALYSIS DIP (DEVICE)
Bilirubin Urine: NEGATIVE
Glucose, UA: NEGATIVE mg/dL
HGB URINE DIPSTICK: NEGATIVE
KETONES UR: NEGATIVE mg/dL
Leukocytes, UA: NEGATIVE
NITRITE: NEGATIVE
PH: 7 (ref 5.0–8.0)
Protein, ur: NEGATIVE mg/dL
SPECIFIC GRAVITY, URINE: 1.015 (ref 1.005–1.030)
Urobilinogen, UA: 0.2 mg/dL (ref 0.0–1.0)

## 2014-02-18 LAB — FETAL NONSTRESS TEST

## 2014-02-18 MED ORDER — URSODIOL 500 MG PO TABS: 500.0000 mg | ORAL_TABLET | Freq: Two times a day (BID) | ORAL | Status: AC

## 2014-02-18 MED ORDER — PANTOPRAZOLE SODIUM 40 MG PO TBEC: 40.0000 mg | DELAYED_RELEASE_TABLET | Freq: Every day | ORAL | Status: AC

## 2014-02-18 NOTE — Patient Instructions (Signed)
Heartburn During Pregnancy  °Heartburn is a burning sensation in the chest caused by stomach acid backing up into the esophagus. Heartburn is common in pregnancy because a certain hormone (progesterone) is released when a woman is pregnant. The progesterone hormone may relax the valve that separates the esophagus from the stomach. This allows acid to go up into the esophagus, causing heartburn. Heartburn may also happen in pregnancy because the enlarging uterus pushes up on the stomach, which pushes more acid into the esophagus. This is especially true in the later stages of pregnancy. Heartburn problems usually go away after giving birth. °CAUSES  °Heartburn is caused by stomach acid backing up into the esophagus. During pregnancy, this may result from various things, including:  °· The progesterone hormone. °· Changing hormone levels. °· The growing uterus pushing stomach acid upward. °· Large meals. °· Certain foods and drinks. °· Exercise. °· Increased acid production. °SIGNS AND SYMPTOMS  °· Burning pain in the chest or lower throat. °· Bitter taste in the mouth. °· Coughing. °DIAGNOSIS  °Your health care provider will typically diagnose heartburn by taking a careful history of your concern. Blood tests may be done to check for a certain type of bacteria that is associated with heartburn. Sometimes, heartburn is diagnosed by prescribing a heartburn medicine to see if the symptoms improve. In some cases, a procedure called an endoscopy may be done. In this procedure, a tube with a light and a camera on the end (endoscope) is used to examine the esophagus and the stomach. °TREATMENT  °Treatment will vary depending on the severity of your symptoms. Your health care provider may recommend: °· Over-the-counter medicines (antacids, acid reducers) for mild heartburn. °· Prescription medicines to decrease stomach acid or to protect your stomach lining. °· Certain changes in your diet. °· Elevating the head of your bed  by putting blocks under the legs. This helps prevent stomach acid from backing up into the esophagus when you are lying down. °HOME CARE INSTRUCTIONS  °· Only take over-the-counter or prescription medicines as directed by your health care provider. °· Raise the head of your bed by putting blocks under the legs if instructed to do so by your health care provider. Sleeping with more pillows is not effective because it only changes the position of your head. °· Do not exercise right after eating. °· Avoid eating 2-3 hours before bed. Do not lie down right after eating. °· Eat small meals throughout the day instead of three large meals. °· Identify foods and beverages that make your symptoms worse and avoid them. Foods you may want to avoid include: °¨ Peppers. °¨ Chocolate. °¨ High-fat foods, including fried foods. °¨ Spicy foods. °¨ Garlic and onions. °¨ Citrus fruits, including oranges, grapefruit, lemons, and limes. °¨ Food containing tomatoes or tomato products. °¨ Mint. °¨ Carbonated and caffeinated drinks. °¨ Vinegar. °SEEK MEDICAL CARE IF: °· You have abdominal pain of any kind. °· You feel burning in your upper abdomen or chest, especially after eating or lying down. °· You have nausea and vomiting. °· Your stomach feels upset after you eat. °SEEK IMMEDIATE MEDICAL CARE IF:  °· You have severe chest pain that goes down your arm or into your jaw or neck. °· You feel sweaty, dizzy, or light-headed. °· You become short of breath. °· You vomit blood. °· You have difficulty or pain with swallowing. °· You have bloody or black, tarry stools. °· You have episodes of heartburn more than 3 times a   week, for more than 2 weeks. °MAKE SURE YOU: °· Understand these instructions. °· Will watch your condition. °· Will get help right away if you are not doing well or get worse. °Document Released: 07/06/2000 Document Revised: 07/14/2013 Document Reviewed: 02/25/2013 °ExitCare® Patient Information ©2015 [employer]. This  information is not intended to replace advice given to you by your health care provider. Make sure you discuss any questions you have with your health care provider. ° °

## 2014-02-18 NOTE — Addendum Note (Signed)
Addended by: Jill SideAY, DIANE L on: 02/18/2014 05:17 PM   Modules accepted: Orders

## 2014-02-18 NOTE — Progress Notes (Addendum)
Pt states she has only 3 days worth of Ursodiol left.  She has no insurance and medication is very expensive - even at [facility].  [facility] as pt will receive a discounted price.

## 2014-02-18 NOTE — Progress Notes (Signed)
Lots of heartburn, will start Protonix. NST reactive today. Still itching due to cholestasis.

## 2014-02-18 NOTE — Progress Notes (Signed)
Patient reports pelvic pressure at times

## 2014-02-23 ENCOUNTER — Ambulatory Visit (INDEPENDENT_AMBULATORY_CARE_PROVIDER_SITE_OTHER): Payer: Self-pay | Admitting: *Deleted

## 2014-02-23 VITALS — BP 114/86 | HR 108

## 2014-02-23 DIAGNOSIS — O26613 Liver and biliary tract disorders in pregnancy, third trimester: Principal | ICD-10-CM

## 2014-02-23 DIAGNOSIS — K831 Obstruction of bile duct: Secondary | ICD-10-CM

## 2014-02-23 DIAGNOSIS — O26619 Liver and biliary tract disorders in pregnancy, unspecified trimester: Secondary | ICD-10-CM

## 2014-02-23 DIAGNOSIS — K838 Other specified diseases of biliary tract: Secondary | ICD-10-CM

## 2014-02-23 NOTE — Progress Notes (Signed)
IOL scheduled 8/17 @ 0630 

## 2014-02-24 NOTE — Progress Notes (Signed)
NST 02/23/14/ reactive

## 2014-02-25 ENCOUNTER — Ambulatory Visit (INDEPENDENT_AMBULATORY_CARE_PROVIDER_SITE_OTHER): Payer: Self-pay | Admitting: Obstetrics & Gynecology

## 2014-02-25 ENCOUNTER — Encounter: Payer: Self-pay | Admitting: Obstetrics & Gynecology

## 2014-02-25 VITALS — BP 118/62 | HR 96 | Temp 97.8°F | Wt 143.6 lb

## 2014-02-25 DIAGNOSIS — O0990 Supervision of high risk pregnancy, unspecified, unspecified trimester: Secondary | ICD-10-CM

## 2014-02-25 DIAGNOSIS — K831 Obstruction of bile duct: Secondary | ICD-10-CM

## 2014-02-25 DIAGNOSIS — K838 Other specified diseases of biliary tract: Secondary | ICD-10-CM

## 2014-02-25 DIAGNOSIS — O26619 Liver and biliary tract disorders in pregnancy, unspecified trimester: Secondary | ICD-10-CM

## 2014-02-25 DIAGNOSIS — O26613 Liver and biliary tract disorders in pregnancy, third trimester: Principal | ICD-10-CM

## 2014-02-25 DIAGNOSIS — O0993 Supervision of high risk pregnancy, unspecified, third trimester: Secondary | ICD-10-CM

## 2014-02-25 LAB — OB RESULTS CONSOLE GBS: STREP GROUP B AG: POSITIVE

## 2014-02-25 LAB — POCT URINALYSIS DIP (DEVICE)
Glucose, UA: NEGATIVE mg/dL
Hgb urine dipstick: NEGATIVE
Ketones, ur: NEGATIVE mg/dL
Leukocytes, UA: NEGATIVE
Nitrite: NEGATIVE
Protein, ur: 30 mg/dL — AB
Specific Gravity, Urine: 1.02 (ref 1.005–1.030)
UROBILINOGEN UA: 0.2 mg/dL (ref 0.0–1.0)
pH: 6.5 (ref 5.0–8.0)

## 2014-02-25 LAB — OB RESULTS CONSOLE GC/CHLAMYDIA
Chlamydia: NEGATIVE
GC PROBE AMP, GENITAL: NEGATIVE

## 2014-02-25 LAB — FETAL NONSTRESS TEST

## 2014-02-25 NOTE — Patient Instructions (Signed)
Labor Induction  Labor induction is when steps are taken to cause a pregnant woman to begin the labor process. Most women go into labor on their own between 37 weeks and 42 weeks of the pregnancy. When this does not happen or when there is a medical need, methods may be used to induce labor. Labor induction causes a pregnant woman's uterus to contract. It also causes the cervix to soften (ripen), open (dilate), and thin out (efface). Usually, labor is not induced before 39 weeks of the pregnancy unless there is a problem with the baby or mother.  Before inducing labor, your health care provider will consider a number of factors, including the following:  The medical condition of you and the baby.   How many weeks along you are.   The status of the baby's lung maturity.   The condition of the cervix.   The position of the baby.  WHAT ARE THE REASONS FOR LABOR INDUCTION? Labor may be induced for the following reasons:  The health of the baby or mother is at risk.   The pregnancy is overdue by 1 week or more.   The water breaks but labor does not start on its own.   The mother has a health condition or serious illness, such as high blood pressure, infection, placental abruption, or diabetes.  The amniotic fluid amounts are low around the baby.   The baby is distressed.  Convenience or wanting the baby to be born on a certain date is not a reason for inducing labor. WHAT METHODS ARE USED FOR LABOR INDUCTION? Several methods of labor induction may be used, such as:   Prostaglandin medicine. This medicine causes the cervix to dilate and ripen. The medicine will also start contractions. It can be taken by mouth or by inserting a suppository into the vagina.   Inserting a thin tube (catheter) with a balloon on the end into the vagina to dilate the cervix. Once inserted, the balloon is expanded with water, which causes the cervix to open.   Stripping the membranes. Your health  care provider separates amniotic sac tissue from the cervix, causing the cervix to be stretched and causing the release of a hormone called progesterone. This may cause the uterus to contract. It is often done during an office visit. You will be sent home to wait for the contractions to begin. You will then come in for an induction.   Breaking the water. Your health care provider makes a hole in the amniotic sac using a small instrument. Once the amniotic sac breaks, contractions should begin. This may still take hours to see an effect.   Medicine to trigger or strengthen contractions. This medicine is given through an IV access tube inserted into a vein in your arm.  All of the methods of induction, besides stripping the membranes, will be done in the hospital. Induction is done in the hospital so that you and the baby can be carefully monitored.  HOW LONG DOES IT TAKE FOR LABOR TO BE INDUCED? Some inductions can take up to 2-3 days. Depending on the cervix, it usually takes less time. It takes longer when you are induced early in the pregnancy or if this is your first pregnancy. If a mother is still pregnant and the induction has been going on for 2-3 days, either the mother will be sent home or a cesarean delivery will be needed. WHAT ARE THE RISKS ASSOCIATED WITH LABOR INDUCTION? Some of the risks of induction   include:   Changes in fetal heart rate, such as too high, too low, or erratic.   Fetal distress.   Chance of infection for the mother and baby.   Increased chance of having a cesarean delivery.   Breaking off (abruption) of the placenta from the uterus (rare).   Uterine rupture (very rare).  When induction is needed for medical reasons, the benefits of induction may outweigh the risks. WHAT ARE SOME REASONS FOR NOT INDUCING LABOR? Labor induction should not be done if:   It is shown that your baby does not tolerate labor.   You have had previous surgeries on your  uterus, such as a myomectomy or the removal of fibroids.   Your placenta lies very low in the uterus and blocks the opening of the cervix (placenta previa).   Your baby is not in a head-down position.   The umbilical cord drops down into the birth canal in front of the baby. This could cut off the baby's blood and oxygen supply.   You have had a previous cesarean delivery.   There are unusual circumstances, such as the baby being extremely premature.  Document Released: 11/28/2006 Document Revised: 03/11/2013 Document Reviewed: 02/05/2013 ExitCare Patient Information 2015 [employer]. This information is not intended to replace advice given to you by your health care provider. Make sure you discuss any questions you have with your health care provider.  

## 2014-02-25 NOTE — Progress Notes (Signed)
IOL scheduled per protocol on 8/17 @ 0630

## 2014-02-25 NOTE — Progress Notes (Signed)
GBS and cx done today Pt c/oworsening itching but, no other sx.  No VB or ctx   NST reviewed and reactive.  Dominique Foster, M.D., Evern CoreFACOG

## 2014-02-26 LAB — GC/CHLAMYDIA PROBE AMP
CT PROBE, AMP APTIMA: NEGATIVE
GC PROBE AMP APTIMA: NEGATIVE

## 2014-02-27 LAB — CULTURE, BETA STREP (GROUP B ONLY)

## 2014-03-01 ENCOUNTER — Encounter: Payer: Self-pay | Admitting: Obstetrics & Gynecology

## 2014-03-02 ENCOUNTER — Ambulatory Visit (INDEPENDENT_AMBULATORY_CARE_PROVIDER_SITE_OTHER): Payer: Self-pay | Admitting: *Deleted

## 2014-03-02 VITALS — BP 123/74 | HR 89

## 2014-03-02 DIAGNOSIS — O26613 Liver and biliary tract disorders in pregnancy, third trimester: Principal | ICD-10-CM

## 2014-03-02 DIAGNOSIS — K831 Obstruction of bile duct: Secondary | ICD-10-CM

## 2014-03-02 DIAGNOSIS — K838 Other specified diseases of biliary tract: Secondary | ICD-10-CM

## 2014-03-02 DIAGNOSIS — O26619 Liver and biliary tract disorders in pregnancy, unspecified trimester: Secondary | ICD-10-CM

## 2014-03-04 ENCOUNTER — Ambulatory Visit (INDEPENDENT_AMBULATORY_CARE_PROVIDER_SITE_OTHER): Payer: Self-pay | Admitting: Obstetrics and Gynecology

## 2014-03-04 ENCOUNTER — Encounter: Payer: Self-pay | Admitting: Obstetrics and Gynecology

## 2014-03-04 VITALS — BP 122/80 | HR 93 | Temp 98.2°F | Wt 145.0 lb

## 2014-03-04 DIAGNOSIS — O26613 Liver and biliary tract disorders in pregnancy, third trimester: Principal | ICD-10-CM

## 2014-03-04 DIAGNOSIS — O0993 Supervision of high risk pregnancy, unspecified, third trimester: Secondary | ICD-10-CM

## 2014-03-04 DIAGNOSIS — K831 Obstruction of bile duct: Secondary | ICD-10-CM

## 2014-03-04 DIAGNOSIS — K838 Other specified diseases of biliary tract: Secondary | ICD-10-CM

## 2014-03-04 DIAGNOSIS — O0990 Supervision of high risk pregnancy, unspecified, unspecified trimester: Secondary | ICD-10-CM

## 2014-03-04 DIAGNOSIS — O26619 Liver and biliary tract disorders in pregnancy, unspecified trimester: Secondary | ICD-10-CM

## 2014-03-04 DIAGNOSIS — O289 Unspecified abnormal findings on antenatal screening of mother: Secondary | ICD-10-CM

## 2014-03-04 DIAGNOSIS — O280 Abnormal hematological finding on antenatal screening of mother: Secondary | ICD-10-CM

## 2014-03-04 LAB — FETAL NONSTRESS TEST

## 2014-03-04 LAB — POCT URINALYSIS DIP (DEVICE)
Bilirubin Urine: NEGATIVE
Glucose, UA: NEGATIVE mg/dL
Hgb urine dipstick: NEGATIVE
Ketones, ur: NEGATIVE mg/dL
NITRITE: NEGATIVE
PROTEIN: NEGATIVE mg/dL
Specific Gravity, Urine: 1.015 (ref 1.005–1.030)
UROBILINOGEN UA: 0.2 mg/dL (ref 0.0–1.0)
pH: 7 (ref 5.0–8.0)

## 2014-03-04 NOTE — Progress Notes (Signed)
Pt complains of itching

## 2014-03-04 NOTE — Progress Notes (Signed)
IOL scheduled 8/17 @ 0630

## 2014-03-04 NOTE — Progress Notes (Signed)
Patient is doing well without complaints. Scheduled for IOL on 8/17. Patient still with pruritis. Advised to take benadryl at bedtime NST reviewed and reactive

## 2014-03-05 ENCOUNTER — Telehealth (HOSPITAL_COMMUNITY): Payer: Self-pay | Admitting: *Deleted

## 2014-03-05 NOTE — Telephone Encounter (Signed)
Preadmission screen  

## 2014-03-07 NOTE — Progress Notes (Signed)
Patient ID: Dominique Foster, female   DOB: [birth year], [age] y.o.   MRN: [id number] NST 03/02/14 reactive

## 2014-03-08 ENCOUNTER — Encounter (HOSPITAL_COMMUNITY): Payer: Self-pay

## 2014-03-08 ENCOUNTER — Inpatient Hospital Stay (HOSPITAL_COMMUNITY)
Admission: RE | Admit: 2014-03-08 | Discharge: 2014-03-12 | DRG: 767 | Disposition: A | Discharge status: Home / Self Care | Payer: Medicaid Other | Source: Ambulatory Visit | Attending: Family Medicine | Admitting: Family Medicine

## 2014-03-08 VITALS — BP 96/55 | HR 75 | Temp 97.8°F | Resp 18 | Ht 61.0 in | Wt 155.4 lb

## 2014-03-08 DIAGNOSIS — K838 Other specified diseases of biliary tract: Secondary | ICD-10-CM | POA: Diagnosis present

## 2014-03-08 DIAGNOSIS — O26649 Intrahepatic cholestasis of pregnancy, unspecified trimester: Secondary | ICD-10-CM | POA: Diagnosis present

## 2014-03-08 DIAGNOSIS — O411090 Infection of amniotic sac and membranes, unspecified, unspecified trimester, not applicable or unspecified: Secondary | ICD-10-CM | POA: Diagnosis present

## 2014-03-08 DIAGNOSIS — Z22330 Carrier of Group B streptococcus: Secondary | ICD-10-CM | POA: Diagnosis not present

## 2014-03-08 DIAGNOSIS — Z833 Family history of diabetes mellitus: Secondary | ICD-10-CM | POA: Diagnosis not present

## 2014-03-08 DIAGNOSIS — Z3490 Encounter for supervision of normal pregnancy, unspecified, unspecified trimester: Secondary | ICD-10-CM

## 2014-03-08 DIAGNOSIS — O9989 Other specified diseases and conditions complicating pregnancy, childbirth and the puerperium: Secondary | ICD-10-CM

## 2014-03-08 DIAGNOSIS — O99892 Other specified diseases and conditions complicating childbirth: Secondary | ICD-10-CM | POA: Diagnosis present

## 2014-03-08 DIAGNOSIS — O26619 Liver and biliary tract disorders in pregnancy, unspecified trimester: Principal | ICD-10-CM | POA: Diagnosis present

## 2014-03-08 DIAGNOSIS — O9902 Anemia complicating childbirth: Secondary | ICD-10-CM | POA: Diagnosis present

## 2014-03-08 DIAGNOSIS — D649 Anemia, unspecified: Secondary | ICD-10-CM | POA: Diagnosis present

## 2014-03-08 DIAGNOSIS — O8612 Endometritis following delivery: Secondary | ICD-10-CM | POA: Diagnosis not present

## 2014-03-08 DIAGNOSIS — K831 Obstruction of bile duct: Secondary | ICD-10-CM | POA: Diagnosis present

## 2014-03-08 HISTORY — DX: Liver and biliary tract disorders in pregnancy, unspecified trimester: O26.619

## 2014-03-08 HISTORY — DX: Intrahepatic cholestasis of pregnancy, unspecified trimester: O26.649

## 2014-03-08 HISTORY — DX: Obstruction of bile duct: K83.1

## 2014-03-08 LAB — RPR

## 2014-03-08 LAB — CBC
HEMATOCRIT: 30.9 % — AB (ref 36.0–46.0)
Hemoglobin: 10.3 g/dL — ABNORMAL LOW (ref 12.0–15.0)
MCH: 22.7 pg — ABNORMAL LOW (ref 26.0–34.0)
MCHC: 33.3 g/dL (ref 30.0–36.0)
MCV: 68.2 fL — AB (ref 78.0–100.0)
Platelets: 247 10*3/uL (ref 150–400)
RBC: 4.53 MIL/uL (ref 3.87–5.11)
RDW: 14 % (ref 11.5–15.5)
WBC: 6.7 10*3/uL (ref 4.0–10.5)

## 2014-03-08 LAB — OB RESULTS CONSOLE RPR: RPR: NONREACTIVE

## 2014-03-08 LAB — OB RESULTS CONSOLE HIV ANTIBODY (ROUTINE TESTING): HIV: NONREACTIVE

## 2014-03-08 LAB — TYPE AND SCREEN
ABO/RH(D): A POS
Antibody Screen: NEGATIVE

## 2014-03-08 MED ORDER — HYDROXYZINE HCL 25 MG PO TABS
25.0000 mg | ORAL_TABLET | ORAL | Status: DC | PRN
  Administered 2014-03-08 – 2014-03-09 (×2): 25 mg via ORAL
  Filled 2014-03-08 (×3): qty 1

## 2014-03-08 MED ORDER — OXYTOCIN BOLUS FROM INFUSION: 500.0000 mL | INTRAVENOUS | Status: DC

## 2014-03-08 MED ORDER — LIDOCAINE HCL (PF) 1 % IJ SOLN
30.0000 mL | INTRAMUSCULAR | Status: DC | PRN
Start: 2014-03-08 — End: 2014-03-10
  Administered 2014-03-10: 30 mL via SUBCUTANEOUS
  Filled 2014-03-08: qty 30

## 2014-03-08 MED ORDER — LIDOCAINE HCL (PF) 1 % IJ SOLN: 30.0000 mL | INTRAMUSCULAR | Status: DC | PRN

## 2014-03-08 MED ORDER — PANTOPRAZOLE SODIUM 40 MG PO TBEC: 40.0000 mg | DELAYED_RELEASE_TABLET | Freq: Every day | ORAL | Status: DC

## 2014-03-08 MED ORDER — LACTATED RINGERS IV SOLN
INTRAVENOUS | Status: DC
  Administered 2014-03-08: 23:00:00 via INTRAVENOUS

## 2014-03-08 MED ORDER — MISOPROSTOL 25 MCG QUARTER TABLET
25.0000 ug | ORAL_TABLET | ORAL | Status: DC
  Administered 2014-03-08 (×3): 25 ug via VAGINAL
  Filled 2014-03-08 (×3): qty 0.25

## 2014-03-08 MED ORDER — TERBUTALINE SULFATE 1 MG/ML IJ SOLN: 0.2500 mg | Freq: Once | INTRAMUSCULAR | Status: AC | PRN

## 2014-03-08 MED ORDER — DEXTROSE 5 % IV SOLN
5.0000 10*6.[IU] | Freq: Once | INTRAVENOUS | Status: AC
  Administered 2014-03-08: 5 10*6.[IU] via INTRAVENOUS
  Filled 2014-03-08: qty 5

## 2014-03-08 MED ORDER — CITRIC ACID-SODIUM CITRATE 334-500 MG/5ML PO SOLN
30.0000 mL | ORAL | Status: DC | PRN
  Filled 2014-03-08: qty 15

## 2014-03-08 MED ORDER — IBUPROFEN 600 MG PO TABS: 600.0000 mg | ORAL_TABLET | Freq: Four times a day (QID) | ORAL | Status: DC | PRN

## 2014-03-08 MED ORDER — ONDANSETRON HCL 4 MG/2ML IJ SOLN: 4.0000 mg | Freq: Four times a day (QID) | INTRAMUSCULAR | Status: DC | PRN

## 2014-03-08 MED ORDER — OXYCODONE-ACETAMINOPHEN 5-325 MG PO TABS: 1.0000 | ORAL_TABLET | ORAL | Status: DC | PRN

## 2014-03-08 MED ORDER — LACTATED RINGERS IV SOLN
500.0000 mL | INTRAVENOUS | Status: DC | PRN
  Administered 2014-03-10: 500 mL via INTRAVENOUS

## 2014-03-08 MED ORDER — MISOPROSTOL 25 MCG QUARTER TABLET
25.0000 ug | ORAL_TABLET | ORAL | Status: DC | PRN
  Administered 2014-03-10: 600 ug via VAGINAL
  Filled 2014-03-08: qty 1

## 2014-03-08 MED ORDER — PENICILLIN G POTASSIUM 5000000 UNITS IJ SOLR
2.5000 10*6.[IU] | INTRAVENOUS | Status: DC
  Administered 2014-03-08 – 2014-03-10 (×10): 2.5 10*6.[IU] via INTRAVENOUS
  Filled 2014-03-08 (×13): qty 2.5

## 2014-03-08 MED ORDER — ACETAMINOPHEN 325 MG PO TABS
650.0000 mg | ORAL_TABLET | ORAL | Status: DC | PRN
  Administered 2014-03-10: 650 mg via ORAL
  Filled 2014-03-08: qty 2

## 2014-03-08 MED ORDER — LACTATED RINGERS IV SOLN
INTRAVENOUS | Status: DC
  Administered 2014-03-08: 900 mL via INTRAVENOUS
  Administered 2014-03-09 (×2): via INTRAVENOUS
  Administered 2014-03-10: 125 mL/h via INTRAVENOUS
  Administered 2014-03-10: 03:00:00 via INTRAVENOUS
  Administered 2014-03-10: 125 mL/h via INTRAVENOUS

## 2014-03-08 MED ORDER — TERBUTALINE SULFATE 1 MG/ML IJ SOLN: 0.2500 mg | Freq: Once | INTRAMUSCULAR | Status: DC | PRN

## 2014-03-08 MED ORDER — ONDANSETRON HCL 4 MG/2ML IJ SOLN
4.0000 mg | Freq: Four times a day (QID) | INTRAMUSCULAR | Status: DC | PRN
Start: 2014-03-08 — End: 2014-03-10

## 2014-03-08 MED ORDER — OXYTOCIN 40 UNITS IN LACTATED RINGERS INFUSION - SIMPLE MED
62.5000 mL/h | INTRAVENOUS | Status: DC
  Filled 2014-03-08 (×2): qty 1000

## 2014-03-08 MED ORDER — LACTATED RINGERS IV SOLN: 500.0000 mL | INTRAVENOUS | Status: DC | PRN

## 2014-03-08 NOTE — Progress Notes (Signed)
LABOR PROGRESS NOTE  Dominique Foster is a 33 y.o. G2P0010 at 2627w0d  admitted for induction of labor due to cholestasis.  Subjective: Comfortable, cervical exam very painful  Objective: BP 112/67  Pulse 69  Temp(Src) 98.7 F (37.1 C) (Oral)  Resp 18  Ht 5' (1.524 m)  Wt 145 lb (65.772 kg)  BMI 28.32 kg/m2  LMP 06/22/2013 or  Filed Vitals:   03/08/14 1017 03/08/14 1109 03/08/14 1238 03/08/14 1521  BP: 110/65 117/64 122/71 112/67  Pulse: 89 82 88 69  Temp: 98.1 F (36.7 C)  98.9 F (37.2 C) 98.7 F (37.1 C)  TempSrc: Oral  Oral Oral  Resp: 18  18 18   Height:      Weight:           FHT:  FHR: 150 bpm, variability: moderate,  accelerations:  Present,  decelerations:  Absent UC:   Irregular q805min  SVE:   Dilation: Fingertip Effacement (%): 30 Station: -2 Exam by:: dherr rn  Dilation: Fingertip Effacement (%): 30 Cervical Position: Posterior;Middle Station: -2 Presentation: Vertex Exam by:: dherr rn   Labs: Lab Results  Component Value Date   WBC 6.7 03/08/2014   HGB 10.3* 03/08/2014   HCT 30.9* 03/08/2014   MCV 68.2* 03/08/2014   PLT 247 03/08/2014    Assessment / Plan: Induction of labor due to cholestasis,  progressing on cytotec  Labor: Progressing normally, unable to place foley, will attempt at next exam Fetal Wellbeing:  Category I Pain Control:  Labor support without medications Anticipated MOD:  NSVD  Dominique Foster ROCIO, MD 03/08/2014, 5:36 PM

## 2014-03-08 NOTE — H&P (Signed)
Dominique Foster is a 33 y.o. female presenting for IOL for cholestasis of pregnancy. Maternal Medical History:  Reason for admission: Cholestasis of pregnancy.  Fetal activity: Perceived fetal activity is normal.    Prenatal complications: Cholestasis of pregnancy    OB History   Grav Para Term Preterm Abortions TAB SAB Ect Mult Living   2 0 0 0 1 0 1 0 0 0      Past Medical History  Diagnosis Date  . Cholestasis of pregnancy    History reviewed. No pertinent past surgical history. Family History: family history includes Diabetes in her paternal grandmother. Social History:  reports that she has never smoked. She has never used smokeless tobacco. She reports that she does not drink alcohol or use illicit drugs.   Prenatal Transfer Tool  Maternal Diabetes: No Genetic Screening: Abnormal:  Results: Elevated AFP Maternal Ultrasounds/Referrals: Normal Fetal Ultrasounds or other Referrals:  None Maternal Substance Abuse:  No Significant Maternal Medications:  None Significant Maternal Lab Results:  None Other Comments:  None  Review of Systems  Constitutional: Negative.   HENT: Negative.   Eyes: Negative.   Respiratory: Negative.   Cardiovascular: Negative.   Gastrointestinal: Negative.   Genitourinary: Negative.   Musculoskeletal: Negative.   Skin: Positive for itching.  Neurological: Negative.   Endo/Heme/Allergies: Negative.   Psychiatric/Behavioral: Negative.       Blood pressure 138/80, pulse 90, temperature 98.1 F (36.7 C), temperature source Oral, resp. rate 18, height 5' (1.524 m), weight 145 lb (65.772 kg), last menstrual period 06/22/2013. Maternal Exam:  Uterine Assessment: No u/c's  Abdomen: Patient reports no abdominal tenderness. Fetal presentation: vertex  Introitus: Normal vulva. Normal vagina.    Fetal Exam Fetal Monitor Review: Mode: ultrasound.   Variability: moderate (6-25 bpm).   Pattern: accelerations present.    Fetal State  Assessment: Category I - tracings are normal.     Physical Exam  Constitutional: She is oriented to person, place, and time. She appears well-developed and well-nourished.  HENT:  Head: Normocephalic.  Neck: Normal range of motion.  Cardiovascular: Normal rate, regular rhythm, normal heart sounds and intact distal pulses.   Respiratory: Effort normal and breath sounds normal.  GI: Soft. Bowel sounds are normal.  Genitourinary: Vagina normal and uterus normal.  Musculoskeletal: Normal range of motion.  Neurological: She is alert and oriented to person, place, and time. She has normal reflexes.  Skin: Skin is warm and dry.  Psychiatric: She has a normal mood and affect. Her behavior is normal. Judgment and thought content normal.    Prenatal labs: ABO, Rh: A/Positive/-- (03/05 0000) Antibody: Negative (03/05 0000) Rubella: Immune (03/05 0000) RPR: Nonreactive, Nonreactive (03/05 0000)  HBsAg: Negative (03/05 0000)  HIV: Non-reactive, Non-reactive (03/05 0000)  GBS: Positive (08/06 0000)   Assessment/Plan: Vertex confermed by u/s. IOL for cholestasis   Dominique Foster 03/08/2014, 7:44 AM

## 2014-03-08 NOTE — Progress Notes (Signed)
Dr Loreta AveAcosta declined to insert vaginal catheter at this time.

## 2014-03-08 NOTE — Progress Notes (Signed)
LABOR PROGRESS NOTE  Dominique Foster is a 33 y.o. G2P0010 at 5976w0d  admitted for induction of labor due to cholestasis.  Subjective: Comfortable. Reports some vaginal blood with urination  Objective: BP 120/58  Pulse 79  Temp(Src) 98.7 F (37.1 C) (Oral)  Resp 18  Ht 5' (1.524 m)  Wt 65.772 kg (145 lb)  BMI 28.32 kg/m2  LMP 06/22/2013 or  Filed Vitals:   03/08/14 1238 03/08/14 1521 03/08/14 1844 03/08/14 2011  BP: 122/71 112/67 127/74 120/58  Pulse: 88 69 94 79  Temp: 98.9 F (37.2 C) 98.7 F (37.1 C) 99.2 F (37.3 C) 98.7 F (37.1 C)  TempSrc: Oral Oral Oral Oral  Resp: 18 18 18 18   Height:      Weight:           FHT:  FHR: 140 bpm, variability: moderate,  accelerations:  Abscent,  decelerations:  Absent UC:   Irregular q585min  SVE:   Dilation: Fingertip Effacement (%): 30 Station: -2 Exam by:: dherr rn  Dilation: Fingertip Effacement (%): 30 Cervical Position: Posterior;Middle Station: -2 Presentation: Vertex Exam by:: dherr rn   Labs: Lab Results  Component Value Date   WBC 6.7 03/08/2014   HGB 10.3* 03/08/2014   HCT 30.9* 03/08/2014   MCV 68.2* 03/08/2014   PLT 247 03/08/2014    Assessment / Plan: Induction of labor due to cholestasis,  progressing on cytotec now with FB  Labor: Progressing normally, FB placed Fetal Wellbeing:  Category I Pain Control:  Labor support without medications Anticipated MOD:  NSVD  Wenda LowJoyner, Veer Elamin, MD 03/08/2014, 9:52 PM

## 2014-03-08 NOTE — Progress Notes (Signed)
I assisted Maury DusDonna RN to confirm that patient states that she speaks [place] and refuses an [employer] tradernterpreter.  [facility] LaboratoriesEda H Ariea Rochin Interpreter [facility]

## 2014-03-09 ENCOUNTER — Inpatient Hospital Stay (HOSPITAL_COMMUNITY): Payer: Medicaid Other | Admitting: Anesthesiology

## 2014-03-09 ENCOUNTER — Encounter (HOSPITAL_COMMUNITY): Payer: Medicaid Other | Admitting: Anesthesiology

## 2014-03-09 ENCOUNTER — Other Ambulatory Visit: Payer: Self-pay

## 2014-03-09 MED ORDER — FENTANYL CITRATE 0.05 MG/ML IJ SOLN
INTRAMUSCULAR | Status: AC
  Filled 2014-03-09: qty 2

## 2014-03-09 MED ORDER — PHENYLEPHRINE 40 MCG/ML (10ML) SYRINGE FOR IV PUSH (FOR BLOOD PRESSURE SUPPORT)
80.0000 ug | PREFILLED_SYRINGE | INTRAVENOUS | Status: DC | PRN
  Filled 2014-03-09: qty 10
  Filled 2014-03-09: qty 2

## 2014-03-09 MED ORDER — LACTATED RINGERS IV SOLN
500.0000 mL | Freq: Once | INTRAVENOUS | Status: AC
  Administered 2014-03-09: 500 mL via INTRAVENOUS

## 2014-03-09 MED ORDER — OXYTOCIN 40 UNITS IN LACTATED RINGERS INFUSION - SIMPLE MED
1.0000 m[IU]/min | INTRAVENOUS | Status: DC
  Administered 2014-03-09: 2 m[IU]/min via INTRAVENOUS
  Administered 2014-03-10: 28 m[IU]/min via INTRAVENOUS
  Filled 2014-03-09: qty 1000

## 2014-03-09 MED ORDER — LIDOCAINE HCL (PF) 1 % IJ SOLN
INTRAMUSCULAR | Status: DC | PRN
  Administered 2014-03-09 (×2): 5 mL

## 2014-03-09 MED ORDER — FENTANYL CITRATE 0.05 MG/ML IJ SOLN
100.0000 ug | INTRAMUSCULAR | Status: DC | PRN
  Administered 2014-03-09 (×3): 100 ug via INTRAVENOUS
  Administered 2014-03-09: 50 ug via INTRAVENOUS
  Administered 2014-03-09 (×3): 100 ug via INTRAVENOUS
  Filled 2014-03-09 (×6): qty 2

## 2014-03-09 MED ORDER — DIPHENHYDRAMINE HCL 50 MG/ML IJ SOLN: 12.5000 mg | INTRAMUSCULAR | Status: DC | PRN

## 2014-03-09 MED ORDER — TERBUTALINE SULFATE 1 MG/ML IJ SOLN: 0.2500 mg | Freq: Once | INTRAMUSCULAR | Status: AC | PRN

## 2014-03-09 MED ORDER — FENTANYL 2.5 MCG/ML BUPIVACAINE 1/10 % EPIDURAL INFUSION (WH - ANES)
14.0000 mL/h | INTRAMUSCULAR | Status: DC | PRN
  Administered 2014-03-09 – 2014-03-10 (×3): 14 mL/h via EPIDURAL
  Filled 2014-03-09 (×3): qty 125

## 2014-03-09 MED ORDER — PHENYLEPHRINE 40 MCG/ML (10ML) SYRINGE FOR IV PUSH (FOR BLOOD PRESSURE SUPPORT)
80.0000 ug | PREFILLED_SYRINGE | INTRAVENOUS | Status: DC | PRN
  Filled 2014-03-09: qty 2

## 2014-03-09 MED ORDER — EPHEDRINE 5 MG/ML INJ
10.0000 mg | INTRAVENOUS | Status: DC | PRN
  Filled 2014-03-09: qty 2

## 2014-03-09 NOTE — Progress Notes (Signed)
LABOR PROGRESS NOTE  Dominique Foster is a 33 y.o. G2P0010 at 1848w1d  admitted for induction of labor due to cholestasis.  Subjective: Comfortable. Starting to feel contractions.   Objective: BP 127/72  Pulse 73  Temp(Src) 98.9 F (37.2 C) (Oral)  Resp 18  Ht 5' (1.524 m)  Wt 65.772 kg (145 lb)  BMI 28.32 kg/m2  LMP 06/22/2013 or  Filed Vitals:   03/09/14 1229 03/09/14 1311 03/09/14 1406 03/09/14 1442  BP: 117/59 107/86  127/72  Pulse: 66 64  73  Temp:    98.9 F (37.2 C)  TempSrc:    Oral  Resp: 18  16 18   Height:      Weight:           FHT:  FHR: 140 bpm, variability: moderate,  accelerations:  Present,  decelerations:  Absent UC:   regular, every 2-3 minutes SVE:   Dilation: 4.5 Effacement (%): 50 Station: -2 Exam by:: hk/dorsey, md  Dilation: 4.5 Effacement (%): 50 Cervical Position: Anterior Station: -2 Presentation: Vertex Exam by:: hk/dorsey, md  Pitocin @ 20 mu/min  Labs: Lab Results  Component Value Date   WBC 6.7 03/08/2014   HGB 10.3* 03/08/2014   HCT 30.9* 03/08/2014   MCV 68.2* 03/08/2014   PLT 247 03/08/2014    Assessment / Plan: Induction of labor due to cholestasis,  progressing well on pitocin  Labor: Starting to feel uncomfortable on pitocin. AROM at 1430, clear fluid. Continue to increase pitocin per protocol.  Fetal Wellbeing:  Category I Pain Control:  Epidural Anticipated MOD:  NSVD  William DaltonMcEachern, Lydia Meng, MD 03/09/2014, 2:51 PM

## 2014-03-09 NOTE — Progress Notes (Signed)
   Dominique Foster is a 33 y.o. G2P0010 at 7163w1d  admitted for induction of labor due to cholestasis.  Subjective: Patient notes bloody show. No large gush of fluid. Regular contractions, pain okay. +fetal movements  Objective: Filed Vitals:   03/09/14 0904 03/09/14 0930 03/09/14 1002 03/09/14 1031  BP: 120/59 114/55 134/82 97/54  Pulse: 69 68 76 70  Temp:   98.1 F (36.7 C)   TempSrc:   Oral   Resp:      Height:      Weight:          FHT:  FHR: 130s bpm, variability: minimal ,  accelerations:  Abscent,  decelerations:  Absent UC:   irregular, every 3 minutes SVE:   Dilation: 4 Effacement (%): 50 Station: -2 Exam by:: hk/Carmel Garfield, md Pitocin @ 12 mu/min  Labs: Lab Results  Component Value Date   WBC 6.7 03/08/2014   HGB 10.3* 03/08/2014   HCT 30.9* 03/08/2014   MCV 68.2* 03/08/2014   PLT 247 03/08/2014    Assessment / Plan: Induction of labor due to cholestasis,  progressing well on pitocin  Labor: Progressing on Pitocin, will continue to increase then AROM Fetal Wellbeing:  Category I Pain Control:  Labor support without medications Anticipated MOD:  NSVD  Dominique Foster, Dominique Foster 03/09/2014, 11:12 AM

## 2014-03-09 NOTE — Anesthesia Preprocedure Evaluation (Signed)
Anesthesia Evaluation  Patient identified by MRN, date of birth, ID band Patient awake    Reviewed: Allergy & Precautions, H&P , Patient's Chart, lab work & pertinent test results  Airway Mallampati: II TM Distance: >3 FB Neck ROM: full    Dental   Pulmonary  breath sounds clear to auscultation        Cardiovascular Rhythm:regular Rate:Normal     Neuro/Psych    GI/Hepatic   Endo/Other    Renal/GU      Musculoskeletal   Abdominal   Peds  Hematology  (+) anemia ,   Anesthesia Other Findings Cholestasis of pregnancy  Reproductive/Obstetrics (+) Pregnancy                           Anesthesia Physical Anesthesia Plan  ASA: II  Anesthesia Plan: Epidural   Post-op Pain Management:    Induction:   Airway Management Planned:   Additional Equipment:   Intra-op Plan:   Post-operative Plan:   Informed Consent: I have reviewed the patients History and Physical, chart, labs and discussed the procedure including the risks, benefits and alternatives for the proposed anesthesia with the patient or authorized representative who has indicated his/her understanding and acceptance.     Plan Discussed with:   Anesthesia Plan Comments:         Anesthesia Quick Evaluation

## 2014-03-09 NOTE — Anesthesia Procedure Notes (Signed)
Epidural Patient location during procedure: OB Start time: 03/09/2014 11:56 PM  Staffing Anesthesiologist: Brayton CavesJACKSON, Sherill Wegener Performed by: anesthesiologist   Preanesthetic Checklist Completed: patient identified, site marked, surgical consent, pre-op evaluation, timeout performed, IV checked, risks and benefits discussed and monitors and equipment checked  Epidural Patient position: sitting Prep: site prepped and draped and DuraPrep Patient monitoring: continuous pulse ox and blood pressure Approach: midline Location: L4-L5 Injection technique: LOR air  Needle:  Needle type: Tuohy  Needle gauge: 17 G Needle length: 9 cm and 9 Needle insertion depth: 5 cm cm Catheter type: closed end flexible Catheter size: 19 Gauge Catheter at skin depth: 10 cm Test dose: negative  Assessment Events: blood not aspirated, injection not painful, no injection resistance, negative IV test and no paresthesia  Additional Notes Patient identified.  Risk benefits discussed including failed block, incomplete pain control, headache, nerve damage, paralysis, blood pressure changes, nausea, vomiting, reactions to medication both toxic or allergic, and postpartum back pain.  Patient expressed understanding and wished to proceed.  All questions were answered.  Sterile technique used throughout procedure and epidural site dressed with sterile barrier dressing. No paresthesia or other complications noted.The patient did not experience any signs of intravascular injection such as tinnitus or metallic taste in mouth nor signs of intrathecal spread such as rapid motor block. Please see nursing notes for vital signs.

## 2014-03-10 ENCOUNTER — Inpatient Hospital Stay (HOSPITAL_COMMUNITY): Payer: Medicaid Other

## 2014-03-10 ENCOUNTER — Encounter (HOSPITAL_COMMUNITY): Payer: Self-pay

## 2014-03-10 DIAGNOSIS — O26619 Liver and biliary tract disorders in pregnancy, unspecified trimester: Secondary | ICD-10-CM

## 2014-03-10 LAB — CBC WITH DIFFERENTIAL/PLATELET
Basophils Absolute: 0 10*3/uL (ref 0.0–0.1)
Basophils Relative: 0 % (ref 0–1)
EOS ABS: 0 10*3/uL (ref 0.0–0.7)
Eosinophils Relative: 0 % (ref 0–5)
HCT: 24.2 % — ABNORMAL LOW (ref 36.0–46.0)
HEMOGLOBIN: 8.2 g/dL — AB (ref 12.0–15.0)
LYMPHS ABS: 1.3 10*3/uL (ref 0.7–4.0)
Lymphocytes Relative: 6 % — ABNORMAL LOW (ref 12–46)
MCH: 23 pg — AB (ref 26.0–34.0)
MCHC: 33.9 g/dL (ref 30.0–36.0)
MCV: 68 fL — AB (ref 78.0–100.0)
Monocytes Absolute: 1.1 10*3/uL — ABNORMAL HIGH (ref 0.1–1.0)
Monocytes Relative: 5 % (ref 3–12)
NEUTROS PCT: 90 % — AB (ref 43–77)
Neutro Abs: 21.2 10*3/uL — ABNORMAL HIGH (ref 1.7–7.7)
Platelets: 193 10*3/uL (ref 150–400)
RBC: 3.56 MIL/uL — ABNORMAL LOW (ref 3.87–5.11)
RDW: 13.6 % (ref 11.5–15.5)
WBC: 23.7 10*3/uL — ABNORMAL HIGH (ref 4.0–10.5)

## 2014-03-10 LAB — MRSA PCR SCREENING: MRSA by PCR: NEGATIVE

## 2014-03-10 LAB — CBC
HCT: 29.4 % — ABNORMAL LOW (ref 36.0–46.0)
HEMOGLOBIN: 9.6 g/dL — AB (ref 12.0–15.0)
MCH: 23 pg — ABNORMAL LOW (ref 26.0–34.0)
MCHC: 32.7 g/dL (ref 30.0–36.0)
MCV: 70.3 fL — AB (ref 78.0–100.0)
Platelets: 261 10*3/uL (ref 150–400)
RBC: 4.18 MIL/uL (ref 3.87–5.11)
RDW: 13.8 % (ref 11.5–15.5)
WBC: 30.9 10*3/uL — AB (ref 4.0–10.5)

## 2014-03-10 MED ORDER — LANOLIN HYDROUS EX OINT: TOPICAL_OINTMENT | CUTANEOUS | Status: DC | PRN

## 2014-03-10 MED ORDER — ACETAMINOPHEN 500 MG PO TABS
1000.0000 mg | ORAL_TABLET | Freq: Four times a day (QID) | ORAL | Status: DC | PRN
  Administered 2014-03-10: 1000 mg via ORAL
  Filled 2014-03-10: qty 2

## 2014-03-10 MED ORDER — ONDANSETRON HCL 4 MG/2ML IJ SOLN
4.0000 mg | INTRAMUSCULAR | Status: DC | PRN
Start: 2014-03-10 — End: 2014-03-12

## 2014-03-10 MED ORDER — TETANUS-DIPHTH-ACELL PERTUSSIS 5-2.5-18.5 LF-MCG/0.5 IM SUSP
0.5000 mL | Freq: Once | INTRAMUSCULAR | Status: DC
Start: 2014-03-11 — End: 2014-03-11

## 2014-03-10 MED ORDER — ZOLPIDEM TARTRATE 5 MG PO TABS
5.0000 mg | ORAL_TABLET | Freq: Every evening | ORAL | Status: DC | PRN
Start: 2014-03-10 — End: 2014-03-12
  Administered 2014-03-10: 5 mg via ORAL
  Filled 2014-03-10: qty 1

## 2014-03-10 MED ORDER — MISOPROSTOL 200 MCG PO TABS
ORAL_TABLET | ORAL | Status: AC
  Administered 2014-03-10: 600 ug via VAGINAL
  Filled 2014-03-10: qty 5

## 2014-03-10 MED ORDER — WITCH HAZEL-GLYCERIN EX PADS: 1.0000 "application " | MEDICATED_PAD | CUTANEOUS | Status: DC | PRN

## 2014-03-10 MED ORDER — DIPHENHYDRAMINE HCL 25 MG PO CAPS
25.0000 mg | ORAL_CAPSULE | Freq: Four times a day (QID) | ORAL | Status: DC | PRN
Start: 2014-03-10 — End: 2014-03-12

## 2014-03-10 MED ORDER — SIMETHICONE 80 MG PO CHEW: 80.0000 mg | CHEWABLE_TABLET | ORAL | Status: DC | PRN

## 2014-03-10 MED ORDER — BENZOCAINE-MENTHOL 20-0.5 % EX AERO
1.0000 "application " | INHALATION_SPRAY | CUTANEOUS | Status: DC | PRN
  Administered 2014-03-10: 1 via TOPICAL
  Filled 2014-03-10 (×2): qty 56

## 2014-03-10 MED ORDER — ONDANSETRON HCL 4 MG PO TABS: 4.0000 mg | ORAL_TABLET | ORAL | Status: DC | PRN

## 2014-03-10 MED ORDER — ACETAMINOPHEN 325 MG PO TABS: 650.0000 mg | ORAL_TABLET | ORAL | Status: DC | PRN

## 2014-03-10 MED ORDER — SENNOSIDES-DOCUSATE SODIUM 8.6-50 MG PO TABS
2.0000 | ORAL_TABLET | ORAL | Status: DC
  Administered 2014-03-10: 2 via ORAL
  Filled 2014-03-10: qty 2

## 2014-03-10 MED ORDER — DIBUCAINE 1 % RE OINT: 1.0000 "application " | TOPICAL_OINTMENT | RECTAL | Status: DC | PRN

## 2014-03-10 MED ORDER — PRENATAL MULTIVITAMIN CH
1.0000 | ORAL_TABLET | Freq: Every day | ORAL | Status: DC
  Administered 2014-03-11 – 2014-03-12 (×2): 1 via ORAL
  Filled 2014-03-10 (×2): qty 1

## 2014-03-10 MED ORDER — SODIUM CHLORIDE 0.9 % IV SOLN
2.0000 g | Freq: Four times a day (QID) | INTRAVENOUS | Status: AC
  Administered 2014-03-10 – 2014-03-12 (×8): 2 g via INTRAVENOUS
  Filled 2014-03-10 (×8): qty 2000

## 2014-03-10 MED ORDER — MISOPROSTOL 200 MCG PO TABS: 600.0000 ug | ORAL_TABLET | Freq: Once | ORAL | Status: DC

## 2014-03-10 MED ORDER — GENTAMICIN SULFATE 40 MG/ML IJ SOLN
140.0000 mg | Freq: Three times a day (TID) | INTRAVENOUS | Status: DC
  Administered 2014-03-10 – 2014-03-12 (×7): 140 mg via INTRAVENOUS
  Filled 2014-03-10 (×8): qty 3.5

## 2014-03-10 MED ORDER — IBUPROFEN 600 MG PO TABS
600.0000 mg | ORAL_TABLET | Freq: Four times a day (QID) | ORAL | Status: DC
  Administered 2014-03-10 – 2014-03-12 (×7): 600 mg via ORAL
  Filled 2014-03-10 (×7): qty 1

## 2014-03-10 MED ORDER — OXYCODONE-ACETAMINOPHEN 5-325 MG PO TABS
1.0000 | ORAL_TABLET | ORAL | Status: DC | PRN
  Administered 2014-03-10 – 2014-03-11 (×4): 2 via ORAL
  Filled 2014-03-10 (×4): qty 2

## 2014-03-10 NOTE — Progress Notes (Signed)
LABOR PROGRESS NOTE  Dominique Foster is a 33 y.o. G2P0010 at [redacted]w[redacted]d  admitted for induction of labor due to cholestasis.  Subjective: Feeling rectal pressure. Recent fever of 100.6   Objective: BP 102/89  Pulse 82  Temp(Src) 100.6 F (38.1 C) (Oral)  Resp 18  Ht 5' (1.524 m)  Wt 145 lb (65.772 kg)  BMI 28.32 kg/m2  SpO2 100%  LMP 06/22/2013 or  Filed Vitals:   03/10/14 0430 03/10/14 0500 03/10/14 0530 03/10/14 0600  BP: 108/61 115/66 116/69 102/89  Pulse: 80 80 77 82  Temp:  100.6 F (38.1 C)    TempSrc:  Oral    Resp: 18 18 18 18   Height:      Weight:      SpO2:        FHT:  FHR: 140 bpm, variability: moderate,  accelerations:  Present,  decelerations:  Absent UC:   regular, every 2-3 minutes SVE:   Dilation: 8.5 Effacement (%): 100 Station: -2 Asyclitic, fetal head rotated to LOA? position  Assessment / Plan: Induction of labor due to cholestasis,  progressing well on pitocin. Now with chorioamnionitis.  Labor: continue pitocin per protocol Fetal Wellbeing:  Category I ID: Will start Amp and Gent; Tylenol given. Patient informed that baby may need more treatment/observation after birth. Continue to watch closely. Pain Control:  Epidural Anticipated MOD: Hopeful for NSVD   Nazyia Gaugh A, MD 03/10/2014, 6:40 AM

## 2014-03-10 NOTE — Progress Notes (Signed)
LABOR PROGRESS NOTE  Dominique Foster is a 33 y.o. G2P0010 at 2220w2d  admitted for induction of labor due to cholestasis.  Subjective: Comfortable. S/p epidural   Objective: BP 113/61  Pulse 75  Temp(Src) 98.1 F (36.7 C) (Oral)  Resp 18  Ht 5' (1.524 m)  Wt 145 lb (65.772 kg)  BMI 28.32 kg/m2  SpO2 100%  LMP 06/22/2013 or  Filed Vitals:   03/10/14 0025 03/10/14 0030 03/10/14 0100 03/10/14 0130  BP: 110/54 107/58 119/62 113/61  Pulse: 67 66 65 75  Temp:   98.1 F (36.7 C)   TempSrc:   Oral   Resp: 18 18 18 18   Height:      Weight:      SpO2: 100% 100%         FHT:  FHR: 140 bpm, variability: moderate,  accelerations:  Present,  decelerations:  Absent UC:   regular, every 2-3 minutes SVE:   Dilation: 6 Effacement (%): 100 Station: -1 Exam by:: Dr Loreta AveAcosta  Dilation: 6 Effacement (%): 100 Cervical Position: Anterior Station: -1 Presentation: Vertex Exam by:: Dr Loreta AveAcosta  Labs: Lab Results  Component Value Date   WBC 6.7 03/08/2014   HGB 10.3* 03/08/2014   HCT 30.9* 03/08/2014   MCV 68.2* 03/08/2014   PLT 247 03/08/2014    Assessment / Plan: Induction of labor due to cholestasis,  progressing well on pitocin  Labor: continue pitocin, position changes as strongly suspect asynclitic presentation given caput Fetal Wellbeing:  Category I Pain Control:  Epidural Anticipated MOD:  NSVD  Dominique Mcginty ROCIO, MD 03/10/2014, 1:43 AM

## 2014-03-10 NOTE — Progress Notes (Signed)
Code hemorrhage called at 1915 after patient blood loss of 1500 cc per Dr. Emelda FearFerguson.  Order set initiated for stage 2. Appropriate individuals responded from phone call.  Foley Catheter replaced, and verbal  orders followed according to Dr. Emelda FearFerguson and protocol.   Radiology called at [phone] for abdominal scan for inaccurate count after delivery to be done at 2000.

## 2014-03-10 NOTE — Plan of Care (Signed)
Problem: Phase I Progression Outcomes Goal: Complete instrument count Outcome: Not Met (add Reason) Sponge count after delivery incorrect. Radiology called for scan. And provider aware  Problem: Phase II Progression Outcomes Goal: Initiate breastfeeding within 1hr delivery Outcome: Not Met (add Reason) Due to maternal status

## 2014-03-10 NOTE — Progress Notes (Addendum)
Patient ID: Dominique Foster, female   DOB: June 13, 1981, 33 y.o.   MRN: [id number] Dominique Foster is a 33 y.o. G2P0010 at 7122w2d admitted for IOL indicated by cholestsis  Subjective: Comfortable with epidural, some urge to push with UCs. Tired (sleepy).   Objective: BP 119/96  Pulse 73  Temp(Src) 99.4 F (37.4 C) (Oral)  Resp 22  Ht 5' (1.524 m)  Wt 65.772 kg (145 lb)  BMI 28.32 kg/m2  SpO2 100%  LMP 06/22/2013  Fetal Heart FHR: 140 bpm, variability: moderate,  accelerations:  Present,  decelerations:  Absent   Contractions: q 2-3 min on pitocin  SVE:   Dilation: 10 Effacement (%): 100 Station: +1 Exam by:: Heather Koran RNC Complete x 3 hr, active 2nd stage x 1hr 45 min. Cx as above with 2 cm caput, OA, clear fluid  Assessment / Plan:  Labor: 2nd stage slow progress> encourage pushing. Dr. Shawnie PonsPratt aware Fetal Wellbeing: Category 1 Pain Control:  adequate Expected mode of delivery: NSVD  POE,Dominique Foster 03/10/2014, 2:50 PM

## 2014-03-10 NOTE — Progress Notes (Signed)
LABOR PROGRESS NOTE  Dominique Foster is a 33 y.o. G2P0010 at 6555w1d  admitted for induction of labor due to cholestasis.  Subjective: Very uncomfortable, tearful   Objective: BP 113/61  Pulse 75  Temp(Src) 98.1 F (36.7 C) (Oral)  Resp 18  Ht 5' (1.524 m)  Wt 145 lb (65.772 kg)  BMI 28.32 kg/m2  SpO2 100%  LMP 06/22/2013 or  Filed Vitals:   03/10/14 0025 03/10/14 0030 03/10/14 0100 03/10/14 0130  BP: 110/54 107/58 119/62 113/61  Pulse: 67 66 65 75  Temp:   98.1 F (36.7 C)   TempSrc:   Oral   Resp: 18 18 18 18   Height:      Weight:      SpO2: 100% 100%         FHT:  FHR: 140 bpm, variability: moderate,  accelerations:  Present,  decelerations:  Absent UC:   regular, every 2-3 minutes SVE:   Dilation: 6 Effacement (%): 100 Station: -1 Exam by:: Dr Loreta AveAcosta  Dilation: 6 Effacement (%): 100 Cervical Position: Anterior Station: -1 Presentation: Vertex Exam by:: Dr Loreta AveAcosta  Labs: Lab Results  Component Value Date   WBC 6.7 03/08/2014   HGB 10.3* 03/08/2014   HCT 30.9* 03/08/2014   MCV 68.2* 03/08/2014   PLT 247 03/08/2014    Assessment / Plan: Induction of labor due to cholestasis,  progressing well on pitocin  Labor: continue pitocin Fetal Wellbeing:  Category I Pain Control:  Prn IV meds, declining epidural at this time Anticipated MOD:  NSVD  Dominique Rao ROCIO, MD 03/10/2014, 1:44 AM

## 2014-03-10 NOTE — Plan of Care (Signed)
Problem: Phase I Progression Outcomes Goal: Assess/evaluate effectiveness of pushing Outcome: Completed/Met Date Met:  03/10/14 Patient labored down for 1 hour   Problem: Phase II Progression Outcomes Goal: Key steps for effective pushing & educate pt/family Outcome: Completed/Met Date Met:  03/10/14 Pt informed of how to push, use of pulling legs, handlebars and breathing.  Goal: Frequent position change(s) Outcome: Completed/Met Date Met:  03/10/14 Pt pushing in different positions and frequent position changes throughout labor Goal: Allow 10 minute rest periods prn Outcome: Completed/Met Date Met:  03/10/14 Pt laboring down once completely dilated

## 2014-03-10 NOTE — Progress Notes (Signed)
ANTIBIOTIC CONSULT NOTE - INITIAL  Pharmacy Consult for Gentamicin Indication: Chorioamnionitis   No Known Allergies  Patient Measurements: Height: 5' (152.4 cm) Weight: 145 lb (65.772 kg) IBW/kg (Calculated) : 45.5 Adjusted Body Weight: 51.6 kg  Vital Signs: Temp: 98.7 F (37.1 C) (08/19 0630) Temp src: Oral (08/19 0630) BP: 123/64 mmHg (08/19 0630) Pulse Rate: 87 (08/19 0630)  Labs:  Recent Labs  03/08/14 0728  WBC 6.7  HGB 10.3*  PLT 247   No results found for this basename: GENTTROUGH, GENTPEAK, GENTRANDOM,  in the last 72 hours   Microbiology: Recent Results (from the past 720 hour(s))  OB RESULTS CONSOLE GBS     Status: None   Collection Time    02/25/14 12:00 AM      Result Value Ref Range Status   GBS Positive   Final  CULTURE, BETA STREP (GROUP B ONLY)     Status: None   Collection Time    02/25/14 10:21 AM      Result Value Ref Range Status   Organism ID, Bacteria GROUP B STREP (S.[email or web address]) ISOLATED   Final   Comment: Testing against S. agalactiae not routinely     performed due to predictability of     AMP/PEN/VAN susceptibility.  GC/CHLAMYDIA PROBE AMP     Status: None   Collection Time    02/25/14 10:21 AM      Result Value Ref Range Status   CT Probe RNA NEGATIVE   Final   GC Probe RNA NEGATIVE   Final   Comment:                                                                                             **Normal Reference Range: Negative**                 Assay performed using the Gen-Probe APTIMA COMBO2 (R) Assay.           Acceptable specimen types for this assay include APTIMA Swabs (Unisex,     endocervical, urethral, or vaginal), first void urine, and ThinPrep     liquid based cytology samples.    Medications:  Ampicillin 2 grams IV Q 6 hrs  Assessment: 33 y.o. female G2P0010 at 4613w2d with chorioaminionitis admitted for IOL due to cholestasis Estimated Ke = 0.343, Vd = 0.38  Goal of Therapy:  Gentamicin peak 6-8 mg/L and  Trough < 1 mg/L  Plan:   Gentamicin 140 mg IV every 8 hrs  Check Scr with next labs if gentamicin continued. Will check gentamicin levels if continued > 72hr or clinically indicated.  Tanisia Yokley Foster 03/10/2014,6:57 AM

## 2014-03-10 NOTE — Progress Notes (Signed)
   Dominique Foster is a 33 y.o. G2P0010 at 8360w2d  admitted for induction of labor due to cholestasis.  Subjective: Patient had a 45 minute trial of pushing. Opted for labor down at 12:55. Pain tolerated. Tired.  Objective: Filed Vitals:   03/10/14 1130 03/10/14 1200 03/10/14 1227 03/10/14 1230  BP: 113/64 128/92 119/69 112/75  Pulse: 80 77 81 77  Temp:      TempSrc:      Resp: 20 22 22    Height:      Weight:      SpO2:       Total I/O In: -  Out: 200 [Urine:200]  FHT:  Baseline 140s, minimal/mod varibility, no accels, however poor reading UC:   Poor reading  SVE:   Dilation: 10 Effacement (%): 100 Station: +1;0 Exam by:: Sharon SellerHeather Koran RNC Pitocin @ 45 mu/min  Labs: Lab Results  Component Value Date   WBC 6.7 03/08/2014   HGB 10.3* 03/08/2014   HCT 30.9* 03/08/2014   MCV 68.2* 03/08/2014   PLT 247 03/08/2014    Assessment / Plan: Induction of labor due to cholestasis,  progressing well on pitocin  Labor: Progressing normally, Passive 2nd stage currently  Fetal Wellbeing:  Category I Pain Control:  Fentanyl Anticipated MOD:  NSVD  [name], Crystal S 03/10/2014, 12:56 PM  Evaluation and management procedures were performed by Resident physician under my supervision/collaboration. Chart reviewed, patient examined by me and I agree with management and plan Passive second stage. Push if urge. Reevaluate q 1/2 hr. .Danae Orleanseirdre C Vikram Tillett, CNM 03/10/2014 1:59 PM

## 2014-03-10 NOTE — Progress Notes (Signed)
Vaginal pack pulled.

## 2014-03-10 NOTE — Plan of Care (Signed)
Problem: Phase II Progression Outcomes Goal: Empty bladder prn Outcome: Not Applicable Date Met:  67/22/77 Patient has foley catheter in place due to epidural placement

## 2014-03-10 NOTE — Progress Notes (Addendum)
Patient ID: Dominique AmesBeatriz A Foster, female   DOB: Dec 12, 1980, 33 y.o.   MRN: [id number] Operative Delivery Note At 6:32 PM a viable and healthy female was delivered via Vaginal, Spontaneous Delivery.  Presentation: vertex; Position: Occiput,; Station: spontaneous expulsion..  Verbal consent: obtained from patient for the management of the RETAINED PLACENTA AND POSTPARTUM HEMORRHAGE grade I..The baby was born direct OA, and the right shoulder was rotated to direct OA and the right shoulder released completely, then expulsion of the fetal body without difficulty. There was a deep second degree laceration, midline, and the placenta was retained. Prior to delivery , u/s had shown (By me) that the placenta was anterior/fundal. The posterior edge led the delivery of the placenta, and after 20+ minutes, the leading edge was teased out with ring forceps mild traction on the edge, and pt valsalva, and the patient gradually delivered the entire placenta. The leading edge was somewhat macerated, the posterior placental tissues and membranes appeared intact. The patient had good uterine tone, but steady bleeding. Cervix was intact, and the uterus gently curetted by banjo curette on all surfaces with minimal tissue obtained, only a possible small membrane fragment anteriorly located.  APGAR: 8, 10; weight 7 lb 10.4 oz (3470 g).   Placenta status: Intact, Spontaneous.   Cord: 3 vessels with the following complications: None.  Cord pH: n/a  Anesthesia: Epidural  Instruments: banjo curette. Episiotomy: None Lacerations: 2nd degree Suture Repair: 2.0 vicryl 2 figure of 8 at perineal body, then continuous running , 2 layer. Est. Blood Loss (mL): 1500. The vagina was packed x 30 mins with a large vag tape.  Prior to the currettage, sponges were counted , and a count of 9 obtained, 5 large, 4 small, on repeated counts, and entire area thoroughly inspected without identified sponge.  The vag sponge (large) will be  removed at 8 pm and xray of the abdomen performed.RESULTS OF XRAY: NEGATIVE FOR RETAINED SPONGE. CBC performed now and in 4 hours. CBC Latest Ref Rng 03/10/2014 03/08/2014 09/24/2013  WBC 4.0 - 10.5 K/uL 30.9(H) 6.7 -  Hemoglobin 12.0 - 15.0 g/dL 4.0(J9.6(L) 10.3(L) 10.8  Hematocrit 36.0 - 46.0 % 29.4(L) 30.9(L) 34  Platelets 150 - 400 K/uL 261 247 252     Mom to AICU.  Baby to Nursery.  Eda Magnussen V 03/10/2014, 7:37 PM

## 2014-03-11 LAB — CBC
HCT: 22.9 % — ABNORMAL LOW (ref 36.0–46.0)
Hemoglobin: 7.8 g/dL — ABNORMAL LOW (ref 12.0–15.0)
MCH: 23.1 pg — ABNORMAL LOW (ref 26.0–34.0)
MCHC: 34.1 g/dL (ref 30.0–36.0)
MCV: 68 fL — ABNORMAL LOW (ref 78.0–100.0)
Platelets: 200 10*3/uL (ref 150–400)
RBC: 3.37 MIL/uL — ABNORMAL LOW (ref 3.87–5.11)
RDW: 13.5 % (ref 11.5–15.5)
WBC: 21.6 10*3/uL — ABNORMAL HIGH (ref 4.0–10.5)

## 2014-03-11 LAB — CBC WITH DIFFERENTIAL/PLATELET
BASOS ABS: 0 10*3/uL (ref 0.0–0.1)
Basophils Relative: 0 % (ref 0–1)
EOS PCT: 0 % (ref 0–5)
Eosinophils Absolute: 0 10*3/uL (ref 0.0–0.7)
HEMATOCRIT: 21.2 % — AB (ref 36.0–46.0)
Hemoglobin: 7.1 g/dL — ABNORMAL LOW (ref 12.0–15.0)
LYMPHS ABS: 0.9 10*3/uL (ref 0.7–4.0)
Lymphocytes Relative: 5 % — ABNORMAL LOW (ref 12–46)
MCH: 22.7 pg — ABNORMAL LOW (ref 26.0–34.0)
MCHC: 33.5 g/dL (ref 30.0–36.0)
MCV: 67.7 fL — AB (ref 78.0–100.0)
MONOS PCT: 4 % (ref 3–12)
Monocytes Absolute: 0.7 10*3/uL (ref 0.1–1.0)
Neutro Abs: 15.7 10*3/uL — ABNORMAL HIGH (ref 1.7–7.7)
Neutrophils Relative %: 91 % — ABNORMAL HIGH (ref 43–77)
Platelets: 185 10*3/uL (ref 150–400)
RBC: 3.13 MIL/uL — ABNORMAL LOW (ref 3.87–5.11)
RDW: 13.7 % (ref 11.5–15.5)
WBC: 17.3 10*3/uL — AB (ref 4.0–10.5)

## 2014-03-11 MED ORDER — LACTATED RINGERS IV SOLN
INTRAVENOUS | Status: DC
  Administered 2014-03-11 (×2): via INTRAVENOUS

## 2014-03-11 MED ORDER — LACTATED RINGERS IV BOLUS (SEPSIS): 1000.0000 mL | Freq: Once | INTRAVENOUS | Status: DC

## 2014-03-11 MED ORDER — DIPHENOXYLATE-ATROPINE 2.5-0.025 MG PO TABS
1.0000 | ORAL_TABLET | Freq: Once | ORAL | Status: AC
  Administered 2014-03-11: 1 via ORAL
  Filled 2014-03-11: qty 1

## 2014-03-11 NOTE — Progress Notes (Signed)
1000cc bolus LR to infuse over 2 hrs started per order Dr. Emelda FearFerguson.

## 2014-03-11 NOTE — Progress Notes (Signed)
Post Partum Day 1, in ICU due to grade I PPH, as well as endometritis s/p chorioamnionitis in labor, with retained placenta, requiring difficult placental extraction and brief curettage with banjo curette to confirm absence of any retained placental tissue. The patient was quite dehydrated at delivery with reduced urine output in last few hours of labor, received 2 liters fluid during resuscitation, and since admit to ICU the urine has been low, 50-75 cc/hr range, most recently 35 cc.   Subjective: Pt is fatigued, c/o uterine tenderness on palpation.  Objective: Blood pressure 106/66, pulse 80, temperature 98.3 F (36.8 C), temperature source Oral, resp. rate 18, height 5\' 1"  (1.549 m), weight 155 lb 6.4 oz (70.489 kg), last menstrual period 06/22/2013, SpO2 96.00%, unknown if currently breastfeeding. 24 hour vitals ranges Temp:  [98.3 F (36.8 C)-103.6 F (39.8 C)] 98.3 F (36.8 C) (08/20 0400) Pulse Rate:  [70-219] 80 (08/20 0515) Resp:  [16-22] 18 (08/20 0515) BP: (91-167)/(50-120) 106/66 mmHg (08/20 0500) SpO2:  [96 %-100 %] 96 % (08/20 0515) Weight:  [155 lb 6.4 oz (70.489 kg)] 155 lb 6.4 oz (70.489 kg) (08/19 2124)  Intake/Output Summary (Last 24 hours) at 03/11/14 0526 Last data filed at 03/11/14 0515  Gross per 24 hour  Intake 3012.23 ml  Output   3340 ml  Net -327.77 ml    Physical Exam:  General: alert, fatigued, no distress, pale and does NOT appear toxic. Lochia: appropriate Chest: clear per recent RN eval Uterine Fundus: tender to palpation, no rebound. Abdomen soft Incision:  DVT Evaluation: No evidence of DVT seen on physical exam.  CBC Latest Ref Rng 03/11/2014 03/10/2014 03/10/2014  WBC 4.0 - 10.5 K/uL 21.6(H) 23.7(H) 30.9(H)  Hemoglobin 12.0 - 15.0 g/dL 7.8(L) 8.2(L) 9.6(L)  Hematocrit 36.0 - 46.0 % 22.9(L) 24.2(L) 29.4(L)  Platelets 150 - 400 K/uL 200 193 261      Assessment/Plan: PPD1, Endometritis, s/p SVD complicated by chorioamnionitis, prolonged  second stage, retained placenta requiring difficult extraction with confirmatory curettage. Dehydration. Plan : fluid bolus           Continue IV antibiotics til afebrile x 24 hr.           Cbc 16:00   LOS: 3 days   Tyrell Brereton V 03/11/2014, 5:20 AM

## 2014-03-11 NOTE — Anesthesia Postprocedure Evaluation (Signed)
  Anesthesia Post-op Note  Patient: Dominique Foster  Procedure(s) Performed: * No procedures listed *  Patient Location: A-ICU  Anesthesia Type:Epidural  Level of Consciousness: awake  Airway and Oxygen Therapy: Patient Spontanous Breathing  Post-op Pain: mild  Post-op Assessment: Post-op Vital signs reviewed and Patient's Cardiovascular Status Stable  Post-op Vital Signs: Reviewed and stable  Last Vitals:  Filed Vitals:   03/11/14 1030  BP:   Pulse: 94  Temp:   Resp: 18    Complications: Pt c/o numbness between toes but able to wiggle legs and feet and lift bottom off the bed.  Pt thinks the tingle between her toes is due to her very swollen feet.  Pt is less numb than she was at midnight.  Pt instructed to call if her numbness worsens.

## 2014-03-11 NOTE — Progress Notes (Signed)
Transferred by w/c to [facility] Rm #[id number]

## 2014-03-11 NOTE — Progress Notes (Signed)
Clinical Social Work  CSW received referral to complete psychosocial assessment. CSW spoke with bedside RN who reports MOB is watching infant safety videos at this time. CSW will follow up at later time.  Dominique Hemenway, LCSW (Coverage for Sarah Venning) 

## 2014-03-11 NOTE — Progress Notes (Signed)
UR chart review completed.  

## 2014-03-11 NOTE — Anesthesia Postprocedure Evaluation (Addendum)
Anesthesia Post Note  Patient: Dominique Foster  Procedure(s) Performed: * No procedures listed *  Anesthesia type: Epidural  Patient location: AICU  Post pain: Pain level controlled  Post assessment: Post-op Vital signs reviewed  Last Vitals:  Filed Vitals:   03/11/14 0745  BP:   Pulse:   Temp:   Resp: 20    Post vital signs: Reviewed  Level of consciousness:alert  Complications: Pt finally asleep RN gave report

## 2014-03-11 NOTE — Lactation Note (Signed)
This note was copied from the chart of Dominique Foster. Lactation Consultation Note    Initial consult with this mom and baby, mom in AICU after PPH, 1500 mls, and has been sleeping most of day. She is now 21 hours post partum, and her baby is 37 2/[redacted] weeks gestation, weighing 7 lbs 10.4 ounces. According to mom's nurse, carol, she has observed the baby breast feed well. I did not want to wake mom, but I set up a DE for her, to protect her milk supply, and provide EBm for the baby. Mom's  Nurse, [name], has agreed to start mom pumping when she wakes up. i advised at this time about 4 times a day. I gave lactation pamphlet to dad, and reivewd this with him. I also called [name], evening LC, and asked if she could check on mom later this evening.   Patient Name: Dominique Foster [id number]'s Date: 03/11/2014 Reason for consult: Initial assessment;Other (Comment) (early term baby, 372/[redacted] weeks gestation, weighing 7 lbs 10.4 ounces)   Maternal Data Formula Feeding for Exclusion: Yes Reason for exclusion: Admission to Intensive Care Unit (ICU) post-partum (mom admitted to A - blood loss 1500 mls) Has patient been taught Hand Expression?: No Does the patient have breastfeeding experience prior to this delivery?: No  Feeding Feeding Type: Bottle Fed - Formula Nipple Type: Slow - flow Length of feed: 15 min  LATCH Score/Interventions Latch: Repeated attempts needed to sustain latch, nipple held in mouth throughout feeding, stimulation needed to elicit sucking reflex. Intervention(s): Adjust position;Assist with latch;Breast massage;Breast compression  Audible Swallowing: Spontaneous and intermittent  Type of Nipple: Everted at rest and after stimulation  Comfort (Breast/Nipple): Soft / non-tender     Hold (Positioning): Assistance needed to correctly position infant at breast and maintain latch. Intervention(s): Breastfeeding basics reviewed;Support Pillows;Position  options;Skin to skin  LATCH Score: 8  Lactation Tools Discussed/Used Tools: Pump Breast pump type: Double-Electric Breast Pump Date initiated:: 03/11/14   Consult Status Consult Status: Follow-up Date: 03/12/14 Follow-up type: In-patient    Alfred LevinsLee, Jannetta Massey Anne 03/11/2014, 3:47 PM

## 2014-03-12 LAB — CBC WITH DIFFERENTIAL/PLATELET
Basophils Absolute: 0 10*3/uL (ref 0.0–0.1)
Basophils Relative: 0 % (ref 0–1)
EOS ABS: 0.2 10*3/uL (ref 0.0–0.7)
Eosinophils Relative: 1 % (ref 0–5)
HEMATOCRIT: 19.7 % — AB (ref 36.0–46.0)
HEMOGLOBIN: 6.8 g/dL — AB (ref 12.0–15.0)
Lymphocytes Relative: 9 % — ABNORMAL LOW (ref 12–46)
Lymphs Abs: 1.3 10*3/uL (ref 0.7–4.0)
MCH: 23.3 pg — ABNORMAL LOW (ref 26.0–34.0)
MCHC: 34.5 g/dL (ref 30.0–36.0)
MCV: 67.5 fL — ABNORMAL LOW (ref 78.0–100.0)
MONO ABS: 0.7 10*3/uL (ref 0.1–1.0)
MONOS PCT: 5 % (ref 3–12)
Neutro Abs: 12.1 10*3/uL — ABNORMAL HIGH (ref 1.7–7.7)
Neutrophils Relative %: 85 % — ABNORMAL HIGH (ref 43–77)
Platelets: 194 10*3/uL (ref 150–400)
RBC: 2.92 MIL/uL — ABNORMAL LOW (ref 3.87–5.11)
RDW: 13.7 % (ref 11.5–15.5)
WBC: 14.3 10*3/uL — ABNORMAL HIGH (ref 4.0–10.5)

## 2014-03-12 MED ORDER — FERROUS SULFATE 325 (65 FE) MG PO TABS
325.0000 mg | ORAL_TABLET | Freq: Three times a day (TID) | ORAL | Status: DC
  Administered 2014-03-12: 325 mg via ORAL

## 2014-03-12 MED ORDER — FERROUS SULFATE 325 (65 FE) MG PO TABS: 325.0000 mg | ORAL_TABLET | Freq: Three times a day (TID) | ORAL | Status: AC

## 2014-03-12 MED ORDER — SENNOSIDES-DOCUSATE SODIUM 8.6-50 MG PO TABS: 2.0000 | ORAL_TABLET | ORAL | Status: AC

## 2014-03-12 MED ORDER — IBUPROFEN 600 MG PO TABS: 600.0000 mg | ORAL_TABLET | Freq: Four times a day (QID) | ORAL | Status: AC

## 2014-03-12 MED ORDER — FERROUS SULFATE 325 (65 FE) MG PO TABS
325.0000 mg | ORAL_TABLET | Freq: Two times a day (BID) | ORAL | Status: DC
Start: 2014-03-12 — End: 2014-03-12
  Filled 2014-03-12: qty 1

## 2014-03-12 NOTE — Lactation Note (Signed)
This note was copied from the chart of Girl Ogechi Abundez-Aranda. Lactation Consultation Note  Patient Name: Girl Starleen BlueBeatriz Abundez-Aranda ZOXWR'UToday's Date: 03/12/2014 Reason for consult: Follow-up assessment Baby 40 hours of life. Mom states not sure how nursing is going. Mom able to hand express colostrum from both breasts. Mom has a hand pump, and was set up with DEBP in hospital, but states that she really has not been pumping. Baby wrapped up with lots of blankets, sleeping. Enc mom to offer lots of STS. Enc mom to offer breast often, especially since she had a large estimated blood loss during delivery. Discussed how blood loss can effect breastfeeding. Discussed nursing with cues and at least 8-12 times/24 hours. Discussed engorgement prevention/treatment. Discussed breastfeeding and returning to work. Mom wants to eat her food and then will call for assistance with latching baby to breast.   Maternal Data    Feeding Feeding Type:  (Mom will call for LC to see latch at next feeding.) Length of feed: 25 min  LATCH Score/Interventions Latch: Repeated attempts needed to sustain latch, nipple held in mouth throughout feeding, stimulation needed to elicit sucking reflex. Intervention(s): Adjust position;Assist with latch  Audible Swallowing: Spontaneous and intermittent  Type of Nipple: Everted at rest and after stimulation  Comfort (Breast/Nipple): Soft / non-tender     Hold (Positioning): No assistance needed to correctly position infant at breast.  LATCH Score: 9  Lactation Tools Discussed/Used     Consult Status Consult Status: PRN    Geralynn OchsWILLIARD, Marcelino Campos 03/12/2014, 11:07 AM

## 2014-03-12 NOTE — Lactation Note (Signed)
This note was copied from the chart of Dominique Foster. Lactation Consultation Note  Patient Name: Dominique Starleen BlueBeatriz Foster ZOXWR'UToday's Date: 03/12/2014 Reason for consult: Follow-up assessment Baby 41 hours of life. Mom called out for assistance with latch. Baby appears to be used to latching to tip of nipple. Assisted mom to latch baby in football position on right breast. Baby latches deeply, suckles rhythmically with intermittent swallows heard. Baby then pushes off nipple and latch is shallow. Enc mom to break seal with finger in corner of baby's mouth, then re-latch more deeply. Baby and mom continued this pattern several times, but length of time baby remained deeply latched increased with each latching. Enc mom to just continue making sure baby stays deeply latched. Mom denies any discomfort. Enc mom to listen for swallows. Referred mom to Baby and Me booklet for number of diapers to expect and EBM storage guidelines. Mom aware of LC phone line for questions and aware of OP/BFSG services. Enc mom to call for assistance as needed.   Maternal Data    Feeding Feeding Type: Breast Fed Length of feed: 25 min  LATCH Score/Interventions Latch: Grasps breast easily, tongue down, lips flanged, rhythmical sucking. Intervention(s): Skin to skin Intervention(s): Adjust position;Assist with latch;Breast compression  Audible Swallowing: Spontaneous and intermittent Intervention(s): Hand expression  Type of Nipple: Everted at rest and after stimulation  Comfort (Breast/Nipple): Soft / non-tender     Hold (Positioning): Assistance needed to correctly position infant at breast and maintain latch. Intervention(s): Breastfeeding basics reviewed;Support Pillows;Position options  LATCH Score: 9  Lactation Tools Discussed/Used     Consult Status Consult Status: PRN    Geralynn OchsWILLIARD, Jia Mohamed 03/12/2014, 12:30 PM

## 2014-03-12 NOTE — Progress Notes (Addendum)
Post Partum Day 2 from SVD with grd 1 PPH of 1.5L Subjective: no complaints, up ad lib, voiding, tolerating PO and + flatus. No SOB, dizziness, or lightheadedness with standing  Objective: Blood pressure 96/55, pulse 75, temperature 97.8 F (36.6 C), temperature source Oral, resp. rate 18, height 5\' 1"  (1.549 m), weight 70.489 kg (155 lb 6.4 oz), last menstrual period 06/22/2013, SpO2 98.00%, unknown if currently breastfeeding.  Physical Exam:  General: alert, cooperative and no distress Lochia: appropriate Uterine Fundus: firm DVT Evaluation: No evidence of DVT seen on physical exam. Negative Homan's sign. No cords or calf tenderness. UOP 1.5L O/N   Recent Labs  03/11/14 1610 03/12/14 0545  HGB 7.1* 6.8*  HCT 21.2* 19.7*    Assessment/Plan: Plan for discharge tomorrow Addition of ferrous sulfate 325mg  BID Considered transfusion;patient hesitant and would like to hold off on any transfusions if possible     LOS: 4 days   [name], Crystal S 03/12/2014, 7:46 AM   I have seen and examined this patient and I agree with the above. Will d/c home today.  See d/c summary. Cam HaiSHAW, Feliberto Stockley CNM 4:18 PM 03/12/2014

## 2014-03-12 NOTE — Discharge Instructions (Signed)
Parto vaginal, Cuidados posteriores  °(Vaginal Delivery, Care After) °Siga estas instrucciones durante las próximas semanas. Estas indicaciones para el alta le proporcionan información general acerca de cómo deberá cuidarse después del parto. El médico también podrá darle instrucciones específicas. El tratamiento ha sido planificado según las prácticas médicas actuales, pero en algunos casos pueden ocurrir problemas. Comuníquese con el médico si tiene algún problema o tiene preguntas al volver a su casa.  °INSTRUCCIONES PARA EL CUIDADO EN EL HOGAR  °· Tome sólo medicamentos de venta libre o recetados, según las indicaciones del médico o del farmacéutico. °· No beba alcohol, especialmente si está amamantando o toma analgésicos. °· No mastique tabaco ni fume. °· No consuma drogas. °· Continúe con un adecuado cuidado perineal. El buen cuidado perineal incluye: °¨ Higienizarse de adelante hacia atrás. °¨ Mantener la zona perineal limpia. °· No use tampones ni duchas vaginales hasta que su médico la autorice. °· Dúchese, lávese el cabello y tome baños de inmersión según las indicaciones de su médico. °· Utilice un sostén que le ajuste bien y que brinde buen soporte a sus mamas. °· Consuma alimentos saludables. °· Beba suficiente líquido para mantener la orina clara o de color amarillo pálido. °· Consuma alimentos ricos en fibra como cereales y panes integrales, arroz, frijoles y frutas y verduras frescas todos los días. Estos alimentos pueden ayudarla a prevenir o aliviar el estreñimiento. °· Siga las recomendaciones de su médico relacionadas con la reanudación de actividades como subir escaleras, conducir automóviles, levantar objetos, hacer ejercicios o viajar. °· Hable con su médico acerca de reanudar la actividad sexual. Volver a la actividad sexual depende del riesgo de infección, la velocidad de la curación y la comodidad y su deseo de reanudarla. °· Trate de que alguien la ayude con las actividades del hogar y con  el recién nacido al menos durante un par de días después de salir del hospital. °· Descanse todo lo que pueda. Trate de descansar o tomar una siesta mientras el bebé está durmiendo. °· Aumente sus actividades gradualmente. °· Cumpla con todas las visitas de control programadas para después del parto. Es muy importante asistir a todas las citas programadas de seguimiento. En estas citas, su médico va a controlarla para asegurarse de que esté sanando física y emocionalmente. °SOLICITE ATENCIÓN MÉDICA SI:  °· Elimina coágulos grandes por la vagina. Guarde algunos coágulos para mostrarle al médico. °· Tiene una secreción con feo olor que proviene de la vagina. °· Tiene dificultad para orinar. °· Orina con frecuencia. °· Siente dolor al orinar. °· Nota un cambio en sus movimientos intestinales. °· Aumenta el enrojecimiento, el dolor o la hinchazón en la zona de la incisión vaginal (episiotomía) o el desgarro vaginal. °· Tiene pus que drena por la episiotomía o el desgarro vaginal. °· La episiotomía o el desgarro vaginal se abren. °· Sus mamas le duelen, están duras o enrojecidas. °· Sufre un dolor intenso de cabeza. °· Tiene visión borrosa o ve manchas. °· Se siente triste o deprimida. °· Tiene pensamientos acerca de lastimarse o dañar al recién nacido. °· Tiene preguntas acerca de su cuidado personal, el cuidado del recién nacido o acerca de los medicamentos. °· Se siente mareada o sufre un desmayo. °· Tiene una erupción. °· Tiene náuseas o vómitos. °· Usted amamantó al bebé y no ha tenido su período menstrual dentro de las 12 semanas después de dejar de amamantar. °· No amamanta al bebé y no tuvo su período menstrual en las últimas 12° semanas después del   partoLance Muss.  Tiene fiebre. SOLICITE ATENCIN MDICA DE INMEDIATO SI:   Siente dolor persistente.  Siente dolor en el pecho.  Le falta el aire.  Se desmaya.  Siente dolor en la pierna.  Siente Physiological scientistdolor en el estmago.  El sangrado vaginal satura dos o ms  apsitos en 1 hora. ASEGRESE DE QUE:   Comprende estas instrucciones.  Controlar su enfermedad.  Recibir ayuda de inmediato si no mejora o si empeora. Document Released: 07/09/2005 Document Revised: 03/11/2013 [facility] Patient Information 2015 [place], [place]. This information is not intended to replace advice given to you by your health care provider. Make sure you discuss any questions you have with your health care provider.   Dieta rica en hierro  (Iron-Rich Diet) Una dieta rica en hierro est compuesta por alimentos que tienen buena cantidad del mismo. El hierro es un mineral importante que se utiliza para formar hemoglobina. La hemoglobina es una protena necesaria para que los glbulos rojos puedan transportar el oxgeno por todo el organismo. El nivel de hierro en sangre puede disminuir por no consumir:  El hierro suficiente en la dieta, por prdidas de [place]angre.  Prdidas de sangre.  Momentos que implican desarrollo, como durante el embarazo o durante el crecimiento y desarrollo de un nio. Niveles bajos de hierro pueden causar una disminucin del nmero de glbulos rojos. El resultado puede ser una anemia por dficit de hierro. Los sntomas de la anemia por dficit de hierro son:   Harrel LemonFalta de Engineer, drillingenerga.  Debilidad.  Irritabilidad.  Aumento de la probabilidad de infecciones adems. Estas son algunas recomendaciones para la ingesta diaria de hierro.   Los varones de ms de 19 aos necesitan 8 mg de hierro Googlepor da.  Las Lexmark Internationalmujeres entre los 19 y los 50 aos necesitan 18 mg de hierro por Futures traderda.  Las mujeres embarazadas necesitan 27 mg de hierro Googlepor da, y AutoZonelas mayores de 19 aos que estn amamantando necesitan 9 mg de hierro por C.H. Robinson Worldwideda.  Las Coca Colamujeres mayores de 50 aos necesitan 8 mg de hierro Googlepor da. FUENTES DE HIERRO Hay dos tipos de hierro presentes en los alimentos: hierro hem y no hem. El hierro hem es mejor absorbido en el organismo que el no hem. El hierro hem se encuentra  en la carne, el pollo y el pescado. El hierro no heme se Consolidated Edisonencuentra en los granos, los porotos y los vegetales. Fuentes de hierro hem Alimento / Hierro (mg)  3 oz (85 gr.) de hgado de pollo / 10 mg  3 oz (85 gr.) de hgado de vaca / 5.5 mg  3 oz (85 gr.) de ostras / 8 mg  3 oz (85 gr.) de carne / 2-3 mg  3 oz (85 gr.) de langostinos / 2.8 mg  3 oz (85 gr.) de pavo / 2 mg  3 oz (85 gr.) de pollo / 1 mg  3 oz (85 gr.) de pescado (atn, halibut) / 1 mg  3 oz (85 gr.) de cerdo / 0.9 mg Fuentes de hierro no heme Alimento / Hierro (mg)  Cereal listo para consumir, fortificado con hierro / 3.9-7 mg   taza de tofu / 3.4 mg   taza de frijoles / 2.6 mg  Patatas al horno con piel / 2.7 mg   taza de esprragos / 2.2 mg  [place] / 2 mg   taza de duraznos disecados / 1.6 mg   taza de pasas de uva / 1.5 mg  1 taza de leche de soja / 1.5 mg  1 rebanada de pan integral / 1.2 mg  1 taza de espinacas / 0.8 mg   taza de brcoli / 0.6 mg LA ABSORCIN DEL HIERRO Ciertos alimentos disminuyen la absorcin del hierro en el organismo. Trate de evitar estos alimentos y bebidas cuando consuma una dieta rica en hierro:  Caf.  TForrestine Him.  Soja. Los alimentos que contienen vitamina C ayudan a aumentar la cantidad de hierro que el organismo absorbe, en especial la de las fuentes de hierro no heme. Consuma alimentos ricos en vitamina C junto con alimentos que contengan hierro para aumentar su absorcin. Los alimentos con alto contenido de vitamina C incluyen una variedad de frutas y Sports administrator. Buenas fuentes de vitamina C son:  Marcell Anger de Probation officer.  [place].  Jinny Sanders.  Mangos.  Toronjas.  Pimientos rojos.  Pimientos verdes.  Brcoli.  Patatas con piel.  Jugo de tomates. Document Released: 04/25/2006 Document Revised: 10/01/2011 [facility] Patient Information 2015 [place], [place]. This information is not intended to replace advice given to you by your health  care provider. Make sure you discuss any questions you have with your health care provider.

## 2014-03-12 NOTE — Discharge Summary (Signed)
Obstetric Discharge Summary Reason for Admission: induction of labor for cholestasis  Prenatal Procedures: Found to have chorioamnionitis Intrapartum Procedures: spontaneous vaginal delivery Postpartum Procedures: curettage Complications-Operative and Postpartum: hemorrhage Hemoglobin  Date Value Ref Range Status  03/12/2014 6.8* 12.0 - 15.0 g/dL Final     REPEATED TO VERIFY     CRITICAL RESULT CALLED TO, READ BACK BY AND VERIFIED WITH:     C CANGIALOSIA 03/12/14 AT 0626 BY H SOEWARDIMAN  09/24/2013 10.8   Final  09/24/2013 10.8   Final     HCT  Date Value Ref Range Status  03/12/2014 19.7* 36.0 - 46.0 % Final  09/24/2013 34   Final  09/24/2013 34   Final    Discharge Diagnoses: Term Pregnancy-delivered  Hospital Course:  Dominique Foster is a 33 y.o. G3P1011 who presented for IOL 2/2 cholestasis. She was found to have chorioamnionitis and started on Amp/Gent.  She had a prolonged 2nd stage of labor, pushing>4hrs, but eventually had a SVD She also had a retained placenta which required manual removal. Curettage was performed to assess for any retained products.  She lost approximately 1.5L of blood. She spiked a fever approximately later therefore there was some concern for seeding during curettage, therefore Amp/gent was continued until she was afebrile x 24hrs.  She was able to ambulate, tolerate PO and void normally. She had been afebrile over 24hrs and her WBC continued to improve. She was discharged home with instructions for postpartum care.    Delivery Note At 6:32 PM a viable and healthy female was delivered via Vaginal, Spontaneous Delivery. Presentation: vertex; Position: [place],; Station: spontaneous expulsion..  Verbal consent: obtained from patient for the management of the RETAINED PLACENTA AND POSTPARTUM HEMORRHAGE grade I..The baby was born direct OA, and the right shoulder was rotated to direct OA and the right shoulder released completely, then expulsion of the  fetal body without difficulty. There was a deep second degree laceration, midline, and the placenta was retained. Prior to delivery , u/s had shown (By me) that the placenta was anterior/fundal. The posterior edge led the delivery of the placenta, and after 20+ minutes, the leading edge was teased out with ring forceps mild traction on the edge, and pt valsalva, and the patient gradually delivered the entire placenta. The leading edge was somewhat macerated, the posterior placental tissues and membranes appeared intact. The patient had good uterine tone, but steady bleeding. Cervix was intact, and the uterus gently curetted by banjo curette on all surfaces with minimal tissue obtained, only a possible small membrane fragment anteriorly located.  APGAR: 8, 10; weight 7 lb 10.4 oz (3470 g).  Placenta status: Intact, Spontaneous.  Cord: 3 vessels with the following complications: None. Cord pH: n/a   Anesthesia: Epidural  Instruments: banjo curette.  Episiotomy: None  Lacerations: 2nd degree  Suture Repair: 2.0 vicryl 2 figure of 8 at perineal body, then continuous running , 2 layer.  Est. Blood Loss (mL): 1500.   The vagina was packed x 30 mins with a large vag tape. Prior to the currettage, sponges were counted , and a count of 9 obtained, 5 large, 4 small, on repeated counts, and entire area thoroughly inspected without identified sponge. The vag sponge (large) will be removed at 8 pm and xray of the abdomen performed.RESULTS OF XRAY: NEGATIVE FOR RETAINED SPONGE.  Mom to AICU. Baby to Nursery.  Physical Exam:  General: alert, cooperative and no distress Lochia: appropriate Uterine Fundus: firm DVT Evaluation: No evidence  of DVT seen on physical exam. Negative Homan's sign. No cords or calf tenderness.  Discharge Information: Date: 03/12/2014 Activity: pelvic rest Diet: routine Medications: PNV and Ibuprofen Baby feeding: plans to breastfeed Contraception: Undecided Condition:  stable Instructions: refer to practice specific booklet Discharge to: home Follow-up Information   [facility] Call today. (to make a follow up appointment for 1-2 weeks)    Specialty:  Obstetrics and Gynecology   Contact information:   [address] [place] [address] [phone]      Newborn Data: Live born female  Birth Weight: 7 lb 10.4 oz (3470 g) APGAR: 8, 10  Home with mother.  Rodrigo Ranrystal Dorsey, MD [facility] PGY-1 03/12/2014, 9:51 AM  I have seen and examined this patient and I agree with the above. Cam HaiSHAW, Million Maharaj CNM 3:47 PM 03/12/2014

## 2014-03-12 NOTE — Lactation Note (Signed)
This note was copied from the chart of Dominique Foster. Lactation Consultation Note Baby had poor feedings w/no interest in feeding d/t spity. Had large emesis and poop, now interested in feeding, assisted in positioning. Mom sitting on side of bed holding baby in cradle position w/pillows in lap for support. Mom has a lot of edema to LE 1+ pit tight legs and feet. Good everted nipples, small breast tissue amount w/wide space between breast. Assisted in football position d/t pain in abd., baby obtained deep latch and heard swallows. Mom comfortable and happy w/latch. Reviewed basic breast feeding tips and positions.  Patient Name: Dominique Foster ZOXWR'UToday's Date: 03/12/2014 Reason for consult: Follow-up assessment;Difficult latch   Maternal Data    Feeding Feeding Type: Breast Fed Length of feed: 15 min (still feeding)  LATCH Score/Interventions Latch: Grasps breast easily, tongue down, lips flanged, rhythmical sucking. Intervention(s): Skin to skin;Teach feeding cues;Waking techniques Intervention(s): Adjust position;Assist with latch;Breast massage;Breast compression  Audible Swallowing: A few with stimulation Intervention(s): Skin to skin;Hand expression;Alternate breast massage  Type of Nipple: Everted at rest and after stimulation  Comfort (Breast/Nipple): Soft / non-tender     Hold (Positioning): Assistance needed to correctly position infant at breast and maintain latch. Intervention(s): Breastfeeding basics reviewed;Support Pillows;Position options;Skin to skin  LATCH Score: 8  Lactation Tools Discussed/Used     Consult Status Consult Status: Follow-up Date: 03/12/14 Follow-up type: In-patient    Charyl DancerCARVER, Lerry Cordrey G 03/12/2014, 5:39 AM

## 2014-03-12 NOTE — Progress Notes (Signed)
Clinical Social Work Department PSYCHOSOCIAL ASSESSMENT - MATERNAL/CHILD 02-24-14  Patient:  Dominique Foster  Account Number:  [id number]  Admit Date:  08/14/2013  Dominique Foster Name:   Dominique Foster    Clinical Social Worker:  Dominique Messing, LCSW   Date/Time:  10-30-13 09:00 AM  Date Referred:  04-23-14   Referral source  Physician     Referred reason  Depression/Anxiety   Other referral source:    I:  FAMILY / [place] legal guardian:  PARENT  Guardian - Name Dominique. Joseph - Age Guardian - Address  [address] [place], [place] [address]   Other household support members/support persons Name Relationship DOB  Dominique Foster MOTHER    Other support:   MOB reports sister is involved.    II  PSYCHOSOCIAL DATA Information Source:  Patient Interview  Occupational hygienist Employment:   MOB works as a Programme researcher, broadcasting/film/video.   Financial resources:  Self Pay If Medicaid - County:   Other  [place] / Grade:   Maternity Care Coordinator / Child Services Coordination / Early Interventions:   MOB reports she [facility] and [facility]. MOB started attending birthing classes through the [facility] but did not complete them after [facility] to [facility].  Cultural issues impacting care:   MOB reports that she believes that family should be involved and teach her how to care for her baby. MOB reports she would not feel comfortable having a stranger assist her and is not interested in support groups at this time.    III  STRENGTHS Strengths  Home prepared for Child (including basic supplies)  Supportive family/friends   Strength comment:  MOB reports that family is very supportive and she has all necessary supplies at home for baby.   IV  RISK FACTORS AND CURRENT PROBLEMS Current Problem:  YES   Risk Factor & Current Problem Patient Issue Family Issue Risk Factor / Current  Problem Comment  Financial Resources [place] MOB reports she needs Medicaid for baby    V  SOCIAL WORK ASSESSMENT CSW received referral due to MOB having a history of depression. CSW reviewed chart and spoke with bedside RN prior to meeting with MOB. CSW met with MOB and grandmother at bedside. CSW introduced myself and explained role. MOB agreeable for grandmother to be involved during assessment.    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 MOB reports that it has been a difficult labor and that she never expected to be at the hospital for a long time. MOB reports that her family members had "easy" deliveries and she was upset that she had to go to Dominique Foster and that she probably won't be able to DC today. MOB reports that she felt depressed early in her pregnancy and had a difficult time adjusting. MOB reports she wanted to get pregnant but this was unplanned and she felt it was a mistake at first. MOB reports she spoke with counselor [facility] and felt better after her session. MOB reports she never considered abortion and was never suicidal but anxious about pregnancy. MOB denies any further depression and no other history of MH problems. CSW spoke with MOB about her feelings now that baby has been born. MOB reports she is nervous that she will not know what baby wants but reports she has been practicing breastfeeding and wants to  be the best mother she can be for baby. MOB reports that she is not currently living with FOB Dominique Foster) but that they are on good terms. MOB is unsure how involved FOB will be with baby. MOB reports that her mother and sister live nearby and she would feel more comfortable with them guiding her than anyone else. CSW educated MOB on baby blues vs PPD and did leave resources in case MOB felt she needed additional follow up.    MOB reports that she plans on taking time off from work in order to care for baby.  MOB reports she has a one bedroom apartment but has all necessary supplies and is ready for baby to come home. MOB is aware of the importance of baby having a safe sleeping arrangement and reports baby has her own crib. MOB reports she has questions about Medicaid. MOB states she had pregnancy Medicaid for a few months but that it was not renewed and she is unsure why. MOB reports she is interested in applying for Medicaid for baby. CSW spoke with financial counselor who reports she will talk with MOB about concerns. MOB has a list of pediatricians in her room and reports she will choose an agency to ensure that baby has proper follow up. MOB reports she drives and family will come with her to appointments if she is nervous.    MOB reports no CSW needs and reports that with family support, she will feel emotionally better and will have help with baby if needed. MOB engaged in assessment and thanked CSW for time but reports she only needs assistance with Medicaid at this time.      VI SOCIAL WORK PLAN Social Work Plan  No Further Intervention Required / No Barriers to Discharge   Type of pt/family education:   PPD vs baby blues  safe sleeping arrangements   If child protective services report - county:   If child protective services report - date:   Information/referral to community resources comment:   "Feelings After Birth" brochure  MOB politely declined community resources and wants to rely on her family   Other social work plan:   CSW is signing off but available if needed.     Dominique Messing, LCSW (Coverage for Lucita Ferrara)

## 2014-03-13 NOTE — Discharge Summary (Signed)
Attestation of Attending Supervision of Advanced Practitioner (PA/CNM/NP): Evaluation and management procedures were performed by the Advanced Practitioner under my supervision and collaboration.  I have reviewed the Advanced Practitioner's note and chart, and I agree with the management and plan.  Tyasia Packard, MD, FACOG Attending Obstetrician & Gynecologist Faculty Practice, [facility]   

## 2014-04-12 ENCOUNTER — Ambulatory Visit: Payer: Self-pay | Admitting: Obstetrics & Gynecology

## 2014-05-24 ENCOUNTER — Encounter (HOSPITAL_COMMUNITY): Payer: Self-pay
